# Patient Record
Sex: Female | Born: 1975
Health system: Southern US, Community
[De-identification: ages and names within clinical notes are randomized; demographics above are authoritative.]

## PROBLEM LIST (undated history)

## (undated) DIAGNOSIS — M24411 Recurrent dislocation, right shoulder: Secondary | ICD-10-CM

## (undated) DIAGNOSIS — E039 Hypothyroidism, unspecified: Secondary | ICD-10-CM

## (undated) DIAGNOSIS — B029 Zoster without complications: Secondary | ICD-10-CM

## (undated) DIAGNOSIS — D593 Hemolytic-uremic syndrome: Secondary | ICD-10-CM

## (undated) HISTORY — DX: Zoster without complications: B02.9

## (undated) HISTORY — DX: Hemolytic-uremic syndrome: D59.3

## (undated) HISTORY — DX: Hypothyroidism, unspecified: E03.9

## (undated) HISTORY — DX: Recurrent dislocation, right shoulder: M24.411

---
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
## 2013-01-05 ENCOUNTER — Telehealth: Payer: Self-pay | Admitting: General Practice

## 2013-01-05 NOTE — Telephone Encounter (Signed)
Called patient, no answer- left message stating we are trying to get in touch with you to ask you a few questions before your appointment with [place], please call us back at the clinics

## 2013-01-05 NOTE — Telephone Encounter (Signed)
Message copied by Kathee Delton on Tue Jan 05, 2013  2:17 PM ------      Message from: Freddi Starr      Created: Mon Jan 04, 2013  2:10 PM       Can we please ensure that patient has insurance prior to arrival for appointment next week. Patient is scheduled as a new patient for pap smear and breast exam. If she does not have insurance we should offer her a referral to [facility] if she would like instead of receiving a bill from [place].             Thanks,             Raynelle Fanning ------

## 2013-01-11 ENCOUNTER — Encounter: Payer: Self-pay | Admitting: Medical

## 2013-01-13 ENCOUNTER — Telehealth: Payer: Self-pay

## 2013-01-13 NOTE — Telephone Encounter (Signed)
Opened in error

## 2013-01-13 NOTE — Telephone Encounter (Signed)
Called pt and I asked pt if she had insurance pt stated that she had [employer].  I explained to the pt that the provider was not for sure that she had insurance and we wanted to make sure so that she did not accrue a bill when we could get a free pap smear screening for her.  Pt stated "no, I do have insurance".  I verified with pt her appt scheduled on 02/03/13 @ 1245 and pt stated understanding.

## 2013-02-04 ENCOUNTER — Encounter: Payer: Self-pay | Admitting: Medical

## 2013-03-19 ENCOUNTER — Encounter: Payer: Self-pay | Admitting: Medical

## 2015-02-26 HISTORY — PX: OTHER SURGICAL HISTORY: SHX169

## 2018-01-16 LAB — HEMOGLOBIN A1C: HEMOGLOBIN A1C: 5.1

## 2018-01-19 LAB — CBC AND DIFFERENTIAL
HCT: 42 (ref 36–46)
Hemoglobin: 13.5 (ref 12.0–16.0)
Platelets: 163 (ref 150–399)
WBC: 5.1

## 2018-01-19 LAB — TSH: TSH: 4.6 (ref 0.41–5.90)

## 2018-01-19 LAB — BASIC METABOLIC PANEL
BUN: 12 (ref 4–21)
Creatinine: 1 (ref 0.5–1.1)
GLUCOSE: 82
Potassium: 4 (ref 3.4–5.3)
SODIUM: 137 (ref 137–147)

## 2018-01-19 LAB — LIPID PANEL
Cholesterol: 214 — AB (ref 0–200)
HDL: 58 (ref 35–70)
LDL Cholesterol: 145
Triglycerides: 58 (ref 40–160)

## 2018-01-19 LAB — HEPATIC FUNCTION PANEL
ALT: 23 (ref 7–35)
AST: 18 (ref 13–35)
Alkaline Phosphatase: 52 (ref 25–125)

## 2018-03-16 ENCOUNTER — Ambulatory Visit: Payer: Self-pay | Admitting: Family Medicine

## 2018-03-30 ENCOUNTER — Ambulatory Visit: Payer: Self-pay | Admitting: Family Medicine

## 2018-03-30 NOTE — Progress Notes (Deleted)
Tara Snyder DOB: 1975/10/28 Encounter date: 03/30/2018  This isa 43 y.o. female who presents to establish care. No chief complaint on file.   History of present illness:  ***   No past medical history on file. *** The histories are not reviewed yet. Please review them in the "History" navigator section and refresh this SmartLink. Allergies not on file No outpatient medications have been marked as taking for the 03/30/18 encounter (Appointment) with Wynn Banker, MD.   Social History   Tobacco Use  . Smoking status: Not on file  Substance Use Topics  . Alcohol use: Not on file   No family history on file.   Review of Systems  Objective:  There were no vitals taken for this visit.      BP Readings from Last 3 Encounters:  No data found for BP   Wt Readings from Last 3 Encounters:  No data found for Wt    Physical Exam  Assessment/Plan:  There are no diagnoses linked to this encounter.  No follow-ups on file.  Theodis Shove, MD

## 2018-04-13 DIAGNOSIS — L810 Postinflammatory hyperpigmentation: Secondary | ICD-10-CM | POA: Diagnosis not present

## 2018-04-13 DIAGNOSIS — L309 Dermatitis, unspecified: Secondary | ICD-10-CM | POA: Diagnosis not present

## 2018-04-20 ENCOUNTER — Encounter

## 2018-04-20 ENCOUNTER — Encounter: Payer: Self-pay | Admitting: Family Medicine

## 2018-04-20 ENCOUNTER — Ambulatory Visit: Payer: Commercial Managed Care - PPO | Admitting: Family Medicine

## 2018-04-20 VITALS — BP 118/60 | HR 80 | Temp 97.9°F | Ht 62.0 in | Wt 191.2 lb

## 2018-04-20 DIAGNOSIS — Z1239 Encounter for other screening for malignant neoplasm of breast: Secondary | ICD-10-CM | POA: Diagnosis not present

## 2018-04-20 DIAGNOSIS — E669 Obesity, unspecified: Secondary | ICD-10-CM

## 2018-04-20 NOTE — Progress Notes (Signed)
Tara Snyder DOB: 08-09-1975 Encounter date: 04/20/2018  This is a 43 y.o. female who presents to establish care. Chief Complaint  Patient presents with  . New Patient (Initial Visit)    History of present illness: Previous PCP had a lot of turnover. Would like more stability.   No specific concerns today. 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 Has recently (in last year+ started back to work).  Seeing derm; rash on leg. Marcha Dutton is working this up. Has biopsy pending. Rash has been there since July.   Has been on thyroid medication for about 5 years. Has been pretty stable on dose. If she misses one she will break out so she is pretty good about taking medication.   As child had Hemolytic Uremic Syndrome. Hasn't had issues with bladder/kidneys since then. Was hospitalized with this. Back in 6th grade.   Told she had high cholesterol with bloodwork in past. Last bloodwork was 3 months ago.  Past Medical History:  Diagnosis Date  . HUS (hemolytic uremic syndrome) ([facility]) 1987  . Hypothyroid   . Shingles   . Shoulder dislocation, recurrent, right    Past Surgical History:  Procedure Laterality Date  . right shoulder  2017   recurrent dislocation right shoulder; surgery for stabilization   No Known Allergies Current Meds  Medication Sig  . clobetasol cream (TEMOVATE) 0.05 % APPLY TO SPOTS TWICE A DAY FOR 2 WEEKS, THEN STOP  . levothyroxine (SYNTHROID, LEVOTHROID) 137 MCG tablet levothyroxine 137 mcg tablet  TAKE 1 TABLET BY MOUTH EVERY DAY   Social History   Tobacco Use  . Smoking status: Never Smoker  . Smokeless tobacco: Never Used  Substance Use Topics  . Alcohol use: Not on file   Family History  Problem Relation Age of Onset  . High Cholesterol Mother   . Heart attack Father   . Heart disease Father   . High Cholesterol Father   . High blood pressure Father   . Cancer Maternal Grandmother        blood  . Other  Maternal Grandmother        skin issues; multiple  . Diabetes Maternal Grandfather        insulin depdt  . High blood pressure Paternal Grandmother   . High Cholesterol Paternal Grandmother   . Stroke Paternal Grandmother 22  . Obesity Paternal Grandmother   . Hearing loss Paternal Grandfather   . Heart attack Paternal Grandfather 77  . Heart disease Paternal Grandfather   . High Cholesterol Brother      Review of Systems  Constitutional: Negative for chills, fatigue and fever.  Respiratory: Negative for cough, chest tightness, shortness of breath and wheezing.   Cardiovascular: Negative for chest pain, palpitations and leg swelling.    Objective:  BP 118/60 (BP Location: Left Arm, Patient Position: Sitting, Cuff Size: Large)   Pulse 80   Temp 97.9 F (36.6 C) (Oral)   Ht 5\' 2"  (1.575 m)   Wt 191 lb 3.2 oz (86.7 kg)   LMP 04/18/2018 (Exact Date) Comment: currently having cycle  SpO2 99%   BMI 34.97 kg/m   Weight: 191 lb 3.2 oz (86.7 kg)   BP Readings from Last 3 Encounters:  04/20/18 118/60   Wt Readings from Last 3 Encounters:  04/20/18 191 lb 3.2 oz (86.7 kg)    Physical Exam Constitutional:      General: She is not in acute distress.  Appearance: She is well-developed.  Cardiovascular:     Rate and Rhythm: Normal rate and regular rhythm.     Heart sounds: Normal heart sounds. No murmur. No friction rub.  Pulmonary:     Effort: Pulmonary effort is normal. No respiratory distress.     Breath sounds: Normal breath sounds. No wheezing or rales.  Abdominal:     General: Abdomen is flat. Bowel sounds are normal. There is no distension.     Palpations: Abdomen is soft.     Tenderness: There is no abdominal tenderness.  Musculoskeletal:     Right lower leg: No edema.     Left lower leg: No edema.  Neurological:     Mental Status: She is alert and oriented to person, place, and time.  Psychiatric:        Behavior: Behavior normal.     Assessment/Plan: 1.  Screening for breast cancer Ordered today and number given to call for scheduling. Let us know if any problems with scheduling. - MM DIGITAL SCREENING BILATERAL; Future  2. Obesity (BMI 30-39.9) Continue with regular exercise.  We discussed stopping snacking after 7 PM at night, which she feels is an area where she is requiring a significant access of calories.  She has had success with weight loss and doing this in the past.  Return pending record review. Once we get records we can review lab work that she has had and establish when her next physical as needed.  Theodis Shove, MD   Is going to try to limit evening snacking and drink hot tea.

## 2018-04-20 NOTE — Patient Instructions (Addendum)
I will call you when I get your records. If you have not heard from me in 2 months; please touch base and ask if we received these.   Remember your why :) I think you will notice a great change with stopping the evening snacking and switching to tea drinking. Let me know if any concerns before you hear from me.

## 2018-04-24 ENCOUNTER — Ambulatory Visit: Payer: Commercial Managed Care - PPO | Admitting: Family Medicine

## 2018-04-24 DIAGNOSIS — L309 Dermatitis, unspecified: Secondary | ICD-10-CM | POA: Diagnosis not present

## 2018-04-30 DIAGNOSIS — L280 Lichen simplex chronicus: Secondary | ICD-10-CM | POA: Diagnosis not present

## 2018-04-30 DIAGNOSIS — L308 Other specified dermatitis: Secondary | ICD-10-CM | POA: Diagnosis not present

## 2018-05-07 DIAGNOSIS — L280 Lichen simplex chronicus: Secondary | ICD-10-CM | POA: Diagnosis not present

## 2018-05-07 DIAGNOSIS — L309 Dermatitis, unspecified: Secondary | ICD-10-CM | POA: Diagnosis not present

## 2018-05-08 ENCOUNTER — Encounter: Payer: Self-pay | Admitting: Family Medicine

## 2018-05-09 ENCOUNTER — Telehealth: Payer: Self-pay | Admitting: Family Medicine

## 2018-05-09 NOTE — Telephone Encounter (Signed)
Please let patient know that I reviewed her record. Her last bloodwork was just in November and looked good. I would suggest we plan to do a repeat physical in November-December. If she would like to do a follow up in may for touching base with weight loss and to do a recheck of her thyroid that would also be reasonable.

## 2018-05-11 NOTE — Telephone Encounter (Signed)
Spoke with patient. She stated as of now she would like to come in around November-December.

## 2018-05-13 DIAGNOSIS — Z7689 Persons encountering health services in other specified circumstances: Secondary | ICD-10-CM | POA: Diagnosis not present

## 2018-05-13 DIAGNOSIS — L309 Dermatitis, unspecified: Secondary | ICD-10-CM | POA: Diagnosis not present

## 2018-05-14 ENCOUNTER — Encounter: Payer: Self-pay | Admitting: Family Medicine

## 2018-05-15 ENCOUNTER — Ambulatory Visit: Payer: Commercial Managed Care - PPO

## 2018-06-12 ENCOUNTER — Ambulatory Visit: Payer: Commercial Managed Care - PPO

## 2018-07-27 ENCOUNTER — Ambulatory Visit: Payer: Commercial Managed Care - PPO

## 2018-08-24 ENCOUNTER — Telehealth: Payer: Self-pay | Admitting: *Deleted

## 2018-08-24 ENCOUNTER — Encounter: Payer: Self-pay | Admitting: Family Medicine

## 2018-08-24 ENCOUNTER — Ambulatory Visit: Payer: Self-pay | Admitting: Family Medicine

## 2018-08-24 ENCOUNTER — Other Ambulatory Visit: Payer: Self-pay

## 2018-08-24 ENCOUNTER — Ambulatory Visit (INDEPENDENT_AMBULATORY_CARE_PROVIDER_SITE_OTHER): Payer: Commercial Managed Care - PPO | Admitting: Family Medicine

## 2018-08-24 ENCOUNTER — Ambulatory Visit: Payer: Commercial Managed Care - PPO

## 2018-08-24 DIAGNOSIS — U071 COVID-19: Secondary | ICD-10-CM

## 2018-08-24 DIAGNOSIS — Z20822 Contact with and (suspected) exposure to covid-19: Secondary | ICD-10-CM

## 2018-08-24 DIAGNOSIS — R6889 Other general symptoms and signs: Secondary | ICD-10-CM

## 2018-08-24 NOTE — Telephone Encounter (Signed)
Pt. Reports her husband had been having COVID symptoms and has tested positive. States she has been tired lately. Would like to be tested. Warm transfer to [place].  Answer Assessment - Initial Assessment Questions 1. CLOSE CONTACT: "Who is the person with the confirmed or suspected COVID-19 infection that you were exposed to?"     Husband 2. PLACE of CONTACT: "Where were you when you were exposed to COVID-19?" (e.g., home, school, medical waiting room; which city?)     Home 3. TYPE of CONTACT: "How much contact was there?" (e.g., sitting next to, live in same house, work in same office, same building)     Home 4. DURATION of CONTACT: "How long were you in contact with the COVID-19 patient?" (e.g., a few seconds, passed by person, a few minutes, live with the patient)     Same house 5. DATE of CONTACT: "When did you have contact with a COVID-19 patient?" (e.g., how many days ago)     Present 6. TRAVEL: "Have you traveled out of the country recently?" If so, "When and where?"     * Also ask about out-of-state travel, since the CDC has identified some high-risk cities for community spread in the [place].     * Note: Travel becomes less relevant if there is widespread community transmission where the patient lives.     No 7. COMMUNITY SPREAD: "Are there lots of cases of COVID-19 (community spread) where you live?" (See public health department website, if unsure)       Yes 8. SYMPTOMS: "Do you have any symptoms?" (e.g., fever, cough, breathing difficulty)     Tired 9. PREGNANCY OR POSTPARTUM: "Is there any chance you are pregnant?" "When was your last menstrual period?" "Did you deliver in the last 2 weeks?"     No 10. HIGH RISK: "Do you have any heart or lung problems? Do you have a weak immune system?" (e.g., CHF, COPD, asthma, HIV positive, chemotherapy, renal failure, diabetes mellitus, sickle cell anemia)       No  Protocols used: CORONAVIRUS (COVID-19) EXPOSURE-A-AH

## 2018-08-24 NOTE — Telephone Encounter (Signed)
covid testing scheduled [place] 6.30.20 at 8:15

## 2018-08-24 NOTE — Telephone Encounter (Signed)
-----   Message from Elie Confer, [place] sent at 08/24/2018 12:30 PM EDT ----- Good afternoon,  Dr. Sarajane Jews would like to have this pt tested for covid 19.  Pts husband was tested and tested positive.  She has had Gi issues and feeling fatigued.  Thanks

## 2018-08-24 NOTE — Progress Notes (Signed)
Subjective:    Patient ID: Tara Snyder, female    DOB: 29-Mar-1975, 43 y.o.   MRN: [id number]  HPI Virtual Visit via Video Note  I connected with the patient on 08/24/18 at  9:45 AM EDT by a video enabled telemedicine application and verified that I am speaking with the correct person using two identifiers.  Location patient: home Location provider:work or home office Persons participating in the virtual visit: patient, provider  I discussed the limitations of evaluation and management by telemedicine and the availability of in person appointments. The patient expressed understanding and agreed to proceed.   HPI: Here asking for a Covid-19 test. She and her husband have had typical symptoms, and she was the first one with symptoms dating back to 14 days ago. She has had abdominal pains, diarrhea, body aches, fatigue, a dry cough, and mild SOB and chest pains. No fever. Her husband developed similar symptoms a few days later, and last week he tested positive for the virus. Today her symptoms are greatly improved. She is drinking fluids. They have been quarantining at home the past 2 weeks.    ROS: See pertinent positives and negatives per HPI.  Past Medical History:  Diagnosis Date  . HUS (hemolytic uremic syndrome) ([facility]) 1987  . Hypothyroid   . Shingles   . Shoulder dislocation, recurrent, right     Past Surgical History:  Procedure Laterality Date  . right shoulder  2017   recurrent dislocation right shoulder; surgery for stabilization    Family History  Problem Relation Age of Onset  . High Cholesterol Mother   . Heart attack Father   . Heart disease Father   . High Cholesterol Father   . High blood pressure Father   . Cancer Maternal Grandmother        blood  . Other Maternal Grandmother        skin issues; multiple  . Diabetes Maternal Grandfather        insulin depdt  . High blood pressure Paternal Grandmother   . High Cholesterol Paternal Grandmother   .  Stroke Paternal Grandmother 41  . Obesity Paternal Grandmother   . Hearing loss Paternal Grandfather   . Heart attack Paternal Grandfather 7  . Heart disease Paternal Grandfather   . High Cholesterol Brother      Current Outpatient Medications:  .  clobetasol cream (TEMOVATE) 0.05 %, APPLY TO SPOTS TWICE A DAY FOR 2 WEEKS, THEN STOP, Disp: , Rfl:  .  levothyroxine (SYNTHROID, LEVOTHROID) 137 MCG tablet, levothyroxine 137 mcg tablet  TAKE 1 TABLET BY MOUTH EVERY DAY, Disp: , Rfl:   EXAM:  VITALS per patient if applicable:  GENERAL: alert, oriented, appears well and in no acute distress  HEENT: atraumatic, conjunttiva clear, no obvious abnormalities on inspection of external nose and ears  NECK: normal movements of the head and neck  LUNGS: on inspection no signs of respiratory distress, breathing rate appears normal, no obvious gross SOB, gasping or wheezing  CV: no obvious cyanosis  MS: moves all visible extremities without noticeable abnormality  PSYCH/NEURO: pleasant and cooperative, no obvious depression or anxiety, speech and thought processing grossly intact  ASSESSMENT AND PLAN: She likely has a Covid-19 infection. We will arrange for her to be tested today. Alysia Penna, MD  Discussed the following assessment and plan:  No diagnosis found.     I discussed the assessment and treatment plan with the patient. The patient was provided an opportunity to ask  questions and all were answered. The patient agreed with the plan and demonstrated an understanding of the instructions.   The patient was advised to call back or seek an in-person evaluation if the symptoms worsen or if the condition fails to improve as anticipated.     Review of Systems     Objective:   Physical Exam        Assessment & Plan:

## 2018-08-24 NOTE — Telephone Encounter (Signed)
LM for pt to call [phone] M-F 7a-7p to schedule covid testing. Order placed  

## 2018-08-24 NOTE — Telephone Encounter (Signed)
Pt has been scheduled for virtual visit  

## 2018-08-25 ENCOUNTER — Other Ambulatory Visit: Payer: Commercial Managed Care - PPO

## 2018-08-25 DIAGNOSIS — Z20822 Contact with and (suspected) exposure to covid-19: Secondary | ICD-10-CM

## 2018-09-01 LAB — NOVEL CORONAVIRUS, NAA: SARS-CoV-2, NAA: DETECTED — AB

## 2018-09-02 ENCOUNTER — Telehealth: Payer: Self-pay | Admitting: Internal Medicine

## 2018-09-02 ENCOUNTER — Encounter: Payer: Self-pay | Admitting: Internal Medicine

## 2018-09-02 DIAGNOSIS — U071 COVID-19: Secondary | ICD-10-CM | POA: Insufficient documentation

## 2018-09-02 NOTE — Telephone Encounter (Signed)
Tested + COVID 19   Please call to inform the patient for further instructions  TMS 

## 2018-09-03 NOTE — Telephone Encounter (Signed)
Dr. Sarajane Jews saw her when I was out.   COVID testing is positive, which she suspected. Please make sure she is symptomatically feeling better (looks like she was starting to feel better at time of appointment).   Needs to be isolated for at least 10 days after start of symptoms AND for at least 72 hours after being symptom free (no fevers, chills, cough, diarrhea).   If any worsening of symptoms needs appointment.

## 2018-09-03 NOTE — Telephone Encounter (Signed)
I left a message for the pt to return my call. 

## 2018-09-07 NOTE — Telephone Encounter (Signed)
See results note. 

## 2019-04-13 ENCOUNTER — Telehealth: Payer: Self-pay | Admitting: Family Medicine

## 2019-04-13 NOTE — Telephone Encounter (Signed)
Pt is requesting a refill on Levothyroxine(LEVOTHROID) 137 MCG tablet. Pt uses [facility] [address] in [place]. Thanks

## 2019-04-14 ENCOUNTER — Other Ambulatory Visit: Payer: Self-pay | Admitting: Family Medicine

## 2019-04-14 MED ORDER — LEVOTHYROXINE SODIUM 137 MCG PO TABS: ORAL_TABLET | ORAL | 1 refills | Status: DC

## 2019-04-14 NOTE — Telephone Encounter (Signed)
done

## 2019-10-13 ENCOUNTER — Ambulatory Visit: Payer: Commercial Managed Care - PPO | Admitting: Family Medicine

## 2019-10-14 ENCOUNTER — Other Ambulatory Visit: Payer: Self-pay | Admitting: Family Medicine

## 2019-10-27 ENCOUNTER — Ambulatory Visit (INDEPENDENT_AMBULATORY_CARE_PROVIDER_SITE_OTHER): Payer: Commercial Managed Care - PPO | Admitting: Family Medicine

## 2019-10-27 ENCOUNTER — Encounter: Payer: Self-pay | Admitting: Family Medicine

## 2019-10-27 ENCOUNTER — Other Ambulatory Visit: Payer: Self-pay

## 2019-10-27 VITALS — BP 98/60 | HR 74 | Temp 98.1°F | Ht 62.0 in

## 2019-10-27 DIAGNOSIS — Z23 Encounter for immunization: Secondary | ICD-10-CM | POA: Diagnosis not present

## 2019-10-27 DIAGNOSIS — E785 Hyperlipidemia, unspecified: Secondary | ICD-10-CM | POA: Diagnosis not present

## 2019-10-27 DIAGNOSIS — Z1159 Encounter for screening for other viral diseases: Secondary | ICD-10-CM | POA: Diagnosis not present

## 2019-10-27 DIAGNOSIS — Z1231 Encounter for screening mammogram for malignant neoplasm of breast: Secondary | ICD-10-CM | POA: Diagnosis not present

## 2019-10-27 DIAGNOSIS — R339 Retention of urine, unspecified: Secondary | ICD-10-CM

## 2019-10-27 LAB — POC URINALSYSI DIPSTICK (AUTOMATED)
Bilirubin, UA: NEGATIVE
Blood, UA: NEGATIVE
Glucose, UA: NEGATIVE
Ketones, UA: NEGATIVE
Leukocytes, UA: NEGATIVE
Nitrite, UA: NEGATIVE
Protein, UA: NEGATIVE
Spec Grav, UA: 1.02 (ref 1.010–1.025)
Urobilinogen, UA: 0.2 E.U./dL
pH, UA: 6 (ref 5.0–8.0)

## 2019-10-27 NOTE — Progress Notes (Signed)
[name] DOB: 07-Feb-1976 Encounter date: 10/27/2019  This is a 44 y.o. female who presents with Chief Complaint  Patient presents with   Dysuria    patient complains of difficulty emptying bladder and leakage x2 months ago, noticed leakage after birth of her son in 2010    History of present illness:  Harder to complete urination. Bends forward to get out some of urine. No discomfort with urination. More urgency for urination at night - gets up once. Always some pressure/sensation of needing to urinate. Daily bowel movements. If not having time to complete urination then can note some leaking. Does leak with stress.   [facility].   Did add omega, vitamin B12, vit D.    No Known Allergies Current Meds  Medication Sig   clobetasol cream (TEMOVATE) 0.05 % APPLY TO SPOTS TWICE A DAY FOR 2 WEEKS, THEN STOP   [DISCONTINUED] levothyroxine (SYNTHROID) 137 MCG tablet TAKE 1 TABLET BY MOUTH EVERY DAY    Review of Systems  Constitutional: Negative for chills, fatigue and fever.  Respiratory: Negative for cough, chest tightness, shortness of breath and wheezing.   Cardiovascular: Negative for chest pain, palpitations and leg swelling.  Genitourinary: Positive for difficulty urinating and frequency. Negative for dysuria and hematuria.    Objective:  BP 98/60 (BP Location: Left Arm, Patient Position: Sitting, Cuff Size: Large)    Pulse 74    Temp 98.1 F (36.7 C) (Oral)    Ht 5\' 2"  (1.575 m)    LMP 10/06/2019 (Approximate)    BMI 34.97 kg/m       BP Readings from Last 3 Encounters:  10/27/19 98/60  04/20/18 118/60   Wt Readings from Last 3 Encounters:  04/20/18 191 lb 3.2 oz (86.7 kg)    Physical Exam Constitutional:      General: She is not in acute distress.    Appearance: She is well-developed.  Cardiovascular:     Rate and Rhythm: Normal rate and regular rhythm.     Heart sounds: Normal heart sounds. No murmur heard.  No friction  rub.  Pulmonary:     Effort: Pulmonary effort is normal. No respiratory distress.     Breath sounds: Normal breath sounds. No wheezing or rales.  Abdominal:     General: Abdomen is flat. Bowel sounds are normal.     Tenderness: There is no abdominal tenderness. There is no right CVA tenderness or left CVA tenderness.  Musculoskeletal:     Right lower leg: No edema.     Left lower leg: No edema.  Neurological:     Mental Status: She is alert and oriented to person, place, and time.  Psychiatric:        Behavior: Behavior normal.     Assessment/Plan  1. Incomplete emptying of bladder Encouraged follow up with [facility]. In meanwhile we will test urine here and make sure no abnormalities. She had bloodwork done through work screening and will try to locate this but last labs were over a year ago.  UA was normal, culture negative. We reviewed that there is likely cystocele or laxity in vaginal wall contributing to poor emptying. She declined vaginal exam today. We discussed that frequently pelvic floor therapy is helpful, but sometimes there is need for surgical correction. Either way, further exam with her gynecologist should help determine best next step.   2. Hyperlipidemia Will recheck labs today.    Return if symptoms worsen or fail  to improve.    Theodis Shove, MD

## 2019-10-27 NOTE — Patient Instructions (Signed)
Call your gynecology office for follow up. Let them know that you are having some "incomplete emptying" of the bladder and would like evaluation for this. We can always refer to urology if needed, but gynecology is a good place to start.

## 2019-10-28 LAB — CBC WITH DIFFERENTIAL/PLATELET
Absolute Monocytes: 389 cells/uL (ref 200–950)
Basophils Absolute: 53 cells/uL (ref 0–200)
Basophils Relative: 0.8 %
Eosinophils Absolute: 488 cells/uL (ref 15–500)
Eosinophils Relative: 7.4 %
HCT: 39 % (ref 35.0–45.0)
Hemoglobin: 13.3 g/dL (ref 11.7–15.5)
Lymphs Abs: 1980 cells/uL (ref 850–3900)
MCH: 30.3 pg (ref 27.0–33.0)
MCHC: 34.1 g/dL (ref 32.0–36.0)
MCV: 88.8 fL (ref 80.0–100.0)
MPV: 11.2 fL (ref 7.5–12.5)
Monocytes Relative: 5.9 %
Neutro Abs: 3689 cells/uL (ref 1500–7800)
Neutrophils Relative %: 55.9 %
Platelets: 170 10*3/uL (ref 140–400)
RBC: 4.39 10*6/uL (ref 3.80–5.10)
RDW: 11.9 % (ref 11.0–15.0)
Total Lymphocyte: 30 %
WBC: 6.6 10*3/uL (ref 3.8–10.8)

## 2019-10-28 LAB — COMPREHENSIVE METABOLIC PANEL
AG Ratio: 2.2 (calc) (ref 1.0–2.5)
ALT: 12 U/L (ref 6–29)
AST: 12 U/L (ref 10–30)
Albumin: 4.2 g/dL (ref 3.6–5.1)
Alkaline phosphatase (APISO): 60 U/L (ref 31–125)
BUN: 14 mg/dL (ref 7–25)
CO2: 26 mmol/L (ref 20–32)
Calcium: 9.5 mg/dL (ref 8.6–10.2)
Chloride: 104 mmol/L (ref 98–110)
Creat: 0.86 mg/dL (ref 0.50–1.10)
Globulin: 1.9 g/dL (calc) (ref 1.9–3.7)
Glucose, Bld: 84 mg/dL (ref 65–99)
Potassium: 4.3 mmol/L (ref 3.5–5.3)
Sodium: 138 mmol/L (ref 135–146)
Total Bilirubin: 0.7 mg/dL (ref 0.2–1.2)
Total Protein: 6.1 g/dL (ref 6.1–8.1)

## 2019-10-28 LAB — URINE CULTURE
MICRO NUMBER:: 10899214
SPECIMEN QUALITY:: ADEQUATE

## 2019-10-28 LAB — LIPID PANEL
Cholesterol: 191 mg/dL (ref <200)
HDL: 57 mg/dL (ref >=50)
LDL Cholesterol (Calc): 118 mg/dL (calc) — ABNORMAL HIGH
Non-HDL Cholesterol (Calc): 134 mg/dL (calc) — ABNORMAL HIGH (ref <130)
Total CHOL/HDL Ratio: 3.4 (calc) (ref <5.0)
Triglycerides: 66 mg/dL (ref <150)

## 2019-10-28 LAB — TSH: TSH: 6.02 mIU/L — ABNORMAL HIGH

## 2019-10-28 LAB — HEPATITIS C ANTIBODY
Hepatitis C Ab: NONREACTIVE
SIGNAL TO CUT-OFF: 0.02 (ref <1.00)

## 2019-10-29 MED ORDER — LEVOTHYROXINE SODIUM 150 MCG PO TABS: 150.0000 ug | ORAL_TABLET | Freq: Every day | ORAL | 1 refills | Status: DC

## 2019-11-14 ENCOUNTER — Other Ambulatory Visit: Payer: Self-pay | Admitting: Family Medicine

## 2019-11-17 ENCOUNTER — Ambulatory Visit
Admission: RE | Admit: 2019-11-17 | Discharge: 2019-11-17 | Disposition: A | Discharge status: Home / Self Care | Payer: Commercial Managed Care - PPO | Source: Ambulatory Visit | Attending: Family Medicine | Admitting: Family Medicine

## 2019-11-17 ENCOUNTER — Other Ambulatory Visit: Payer: Self-pay

## 2019-11-17 DIAGNOSIS — Z1231 Encounter for screening mammogram for malignant neoplasm of breast: Secondary | ICD-10-CM

## 2020-04-24 ENCOUNTER — Other Ambulatory Visit: Payer: Self-pay | Admitting: Family Medicine

## 2020-08-04 ENCOUNTER — Other Ambulatory Visit: Payer: Self-pay | Admitting: Family Medicine

## 2020-10-29 ENCOUNTER — Other Ambulatory Visit: Payer: Self-pay | Admitting: Family Medicine

## 2021-01-30 ENCOUNTER — Other Ambulatory Visit: Payer: Self-pay | Admitting: Family Medicine

## 2021-02-13 ENCOUNTER — Telehealth: Payer: Self-pay | Admitting: Family Medicine

## 2021-02-13 ENCOUNTER — Other Ambulatory Visit: Payer: Self-pay | Admitting: Family Medicine

## 2021-02-13 NOTE — Telephone Encounter (Signed)
Rx done. 

## 2021-02-13 NOTE — Telephone Encounter (Signed)
Pt has sch for and appt on 03-21-2021 and would like a refill on levothyroxine (SYNTHROID) 150 MCG tablet  [id number] - [place], [place] - [address] [place] [address] AT [place] [address] Phone:  [phone]  Fax:  [phone]

## 2021-03-07 ENCOUNTER — Other Ambulatory Visit: Payer: Self-pay | Admitting: Family Medicine

## 2021-03-08 LAB — HM PAP SMEAR: HM Pap smear: NEGATIVE

## 2021-03-21 ENCOUNTER — Ambulatory Visit (INDEPENDENT_AMBULATORY_CARE_PROVIDER_SITE_OTHER): Payer: Commercial Managed Care - PPO | Admitting: Family Medicine

## 2021-03-21 VITALS — BP 104/74 | HR 73 | Temp 97.5°F | Ht 62.0 in | Wt 196.5 lb

## 2021-03-21 DIAGNOSIS — E039 Hypothyroidism, unspecified: Secondary | ICD-10-CM | POA: Diagnosis not present

## 2021-03-21 DIAGNOSIS — E785 Hyperlipidemia, unspecified: Secondary | ICD-10-CM

## 2021-03-21 DIAGNOSIS — Z8632 Personal history of gestational diabetes: Secondary | ICD-10-CM | POA: Diagnosis not present

## 2021-03-21 DIAGNOSIS — Z8249 Family history of ischemic heart disease and other diseases of the circulatory system: Secondary | ICD-10-CM | POA: Diagnosis not present

## 2021-03-21 DIAGNOSIS — R232 Flushing: Secondary | ICD-10-CM

## 2021-03-21 NOTE — Progress Notes (Signed)
Tara Snyder DOB: 1976/01/29 Encounter date: 03/21/2021  This is a 46 y.o. female who presents with Chief Complaint  Patient presents with   Follow-up    History of present illness:  Feels like she has been eating fairly well, but weight is not improving. She has been stuck around current weight. Would like to be around 150. Tried intermittent fasting - did ok before holidays. Doesn't usually eat breakfast. Doing more whole grains. Walks about 5 days/week about 1-2 miles. (30 min)  She does feel more tired.   Trouble with staying asleep at night. No racing thoughts.   Seeing [facility].   Periods are light; variable length; variable onset.   Gestational diabetes diet controlled with first pregnancy.    No Known Allergies Current Meds  Medication Sig   levothyroxine (SYNTHROID) 150 MCG tablet TAKE 1 TABLET BY MOUTH EVERY DAY    Review of Systems  Constitutional:  Negative for chills, fatigue and fever.  Respiratory:  Negative for cough, chest tightness, shortness of breath and wheezing.   Cardiovascular:  Negative for chest pain, palpitations and leg swelling.  Genitourinary:        Irregular menses; she is getting hot flashes daily; sometimes multiple times through day.   Psychiatric/Behavioral:  Sleep disturbance: does wake up due to hot flashes.    Objective:  BP 104/74 (BP Location: Left Arm, Patient Position: Sitting, Cuff Size: Large)    Pulse 73    Temp (!) 97.5 F (36.4 C) (Oral)    Ht 5\' 2"  (1.575 m)    Wt 196 lb 8 oz (89.1 kg)    LMP 12/26/2020 (Approximate)    SpO2 99%    BMI 35.94 kg/m   Weight: 196 lb 8 oz (89.1 kg)   BP Readings from Last 3 Encounters:  03/21/21 104/74  10/27/19 98/60  04/20/18 118/60   Wt Readings from Last 3 Encounters:  03/21/21 196 lb 8 oz (89.1 kg)  04/20/18 191 lb 3.2 oz (86.7 kg)    Physical Exam Constitutional:      General: She is not in acute distress.    Appearance: She is well-developed.   Neck:     Thyroid: No thyroid mass, thyromegaly or thyroid tenderness.  Cardiovascular:     Rate and Rhythm: Normal rate and regular rhythm.     Heart sounds: Normal heart sounds. No murmur heard.   No friction rub.  Pulmonary:     Effort: Pulmonary effort is normal. No respiratory distress.     Breath sounds: Normal breath sounds. No wheezing or rales.  Musculoskeletal:     Right lower leg: No edema.     Left lower leg: No edema.  Neurological:     Mental Status: She is alert and oriented to person, place, and time.  Psychiatric:        Behavior: Behavior normal.    Assessment/Plan 1. Hypothyroidism, unspecified type She is due for recheck of blood work.  Uncertain if thyroid is well controlled as last labs were done quite some time ago.   - CBC with Differential/Platelet; Future - TSH; Future - TSH - CBC with Differential/Platelet  2. Hyperlipidemia, unspecified hyperlipidemia type She does worry about family history of heart disease.  Last cholesterol was fairly well controlled.  She had blood work done through work and we will try to get the results of this.  States that cholesterol looked good this past year as well.  We discussed consideration for NMR  profile in the future.  We discussed coronary calcium scoring testing due to father's premature heart attack and coronary artery disease.  She is interested in doing this and taking steps for prevention of cardiovascular disease in herself. - Comprehensive metabolic panel; Future - Comprehensive metabolic panel  3. History of gestational diabetes She is having some difficulty with weight loss.  She is perimenopausal.  Does have a history of gestational diabetes which we discussed likely means that she has some insulin resistance and may also be more difficult.  Encouraged her to continue lower carbohydrate diet.  I will check some blood work today.  We did discuss consideration for weight loss medications in the GLP-1 category if  desired.  Encouraged her to continue with exercise and continue to work on high intensity interval training to help with weight loss. - Hemoglobin A1c; Future - Hemoglobin A1c  4. Family history of heart disease - CT CARDIAC SCORING (SELF PAY ONLY); Future  5. Hot flashes She is following regularly with gynecology.  I did give her some information about dietary and nonhormonal control of hot flashes today.  We discussed that there are some medications that can help with this if it is becoming a nuisance or interference in her daily activities.    Return for pending lab or imaging results.     Micheline Rough, MD

## 2021-03-21 NOTE — Patient Instructions (Signed)
If you have lipid panel from your work; please get those results to me to add to your chart.

## 2021-03-22 ENCOUNTER — Other Ambulatory Visit: Payer: Self-pay | Admitting: Family Medicine

## 2021-03-22 LAB — COMPREHENSIVE METABOLIC PANEL
ALT: 14 U/L (ref 0–35)
AST: 15 U/L (ref 0–37)
Albumin: 4.5 g/dL (ref 3.5–5.2)
Alkaline Phosphatase: 63 U/L (ref 39–117)
BUN: 18 mg/dL (ref 6–23)
CO2: 29 mEq/L (ref 19–32)
Calcium: 9.8 mg/dL (ref 8.4–10.5)
Chloride: 102 mEq/L (ref 96–112)
Creatinine, Ser: 0.91 mg/dL (ref 0.40–1.20)
GFR: 76.39 mL/min (ref >60.00)
Glucose, Bld: 91 mg/dL (ref 70–99)
Potassium: 4.1 mEq/L (ref 3.5–5.1)
Sodium: 138 mEq/L (ref 135–145)
Total Bilirubin: 0.6 mg/dL (ref 0.2–1.2)
Total Protein: 6.9 g/dL (ref 6.0–8.3)

## 2021-03-22 LAB — CBC WITH DIFFERENTIAL/PLATELET
Basophils Absolute: 0 10*3/uL (ref 0.0–0.1)
Basophils Relative: 0.8 % (ref 0.0–3.0)
Eosinophils Absolute: 0.5 10*3/uL (ref 0.0–0.7)
Eosinophils Relative: 8.3 % — ABNORMAL HIGH (ref 0.0–5.0)
HCT: 40.4 % (ref 36.0–46.0)
Hemoglobin: 13.8 g/dL (ref 12.0–15.0)
Lymphocytes Relative: 33.8 % (ref 12.0–46.0)
Lymphs Abs: 2 10*3/uL (ref 0.7–4.0)
MCHC: 34.2 g/dL (ref 30.0–36.0)
MCV: 84.9 fl (ref 78.0–100.0)
Monocytes Absolute: 0.3 10*3/uL (ref 0.1–1.0)
Monocytes Relative: 5.3 % (ref 3.0–12.0)
Neutro Abs: 3 10*3/uL (ref 1.4–7.7)
Neutrophils Relative %: 51.8 % (ref 43.0–77.0)
Platelets: 174 10*3/uL (ref 150.0–400.0)
RBC: 4.76 Mil/uL (ref 3.87–5.11)
RDW: 12.4 % (ref 11.5–15.5)
WBC: 5.8 10*3/uL (ref 4.0–10.5)

## 2021-03-22 LAB — TSH: TSH: 0.24 u[IU]/mL — ABNORMAL LOW (ref 0.35–5.50)

## 2021-03-22 LAB — HEMOGLOBIN A1C: Hgb A1c MFr Bld: 5.1 % (ref 4.6–6.5)

## 2021-03-23 NOTE — Telephone Encounter (Signed)
Patient informed of the message below.

## 2021-03-23 NOTE — Telephone Encounter (Signed)
Take 1 tablet daily, but 1 day a week take just half tablet. She is slightly over-replaced and this should help her get between previous dosing and current dosing.

## 2021-05-04 ENCOUNTER — Inpatient Hospital Stay: Admission: RE | Admit: 2021-05-04 | Payer: Self-pay | Source: Ambulatory Visit

## 2021-06-28 ENCOUNTER — Other Ambulatory Visit: Payer: Self-pay

## 2021-07-05 ENCOUNTER — Encounter: Payer: Self-pay | Admitting: Radiology

## 2021-07-05 ENCOUNTER — Ambulatory Visit
Admission: RE | Admit: 2021-07-05 | Discharge: 2021-07-05 | Disposition: A | Discharge status: Home / Self Care | Payer: Self-pay | Source: Ambulatory Visit | Attending: Family Medicine | Admitting: Family Medicine

## 2021-07-05 DIAGNOSIS — Z8249 Family history of ischemic heart disease and other diseases of the circulatory system: Secondary | ICD-10-CM

## 2021-09-21 ENCOUNTER — Other Ambulatory Visit: Payer: Self-pay | Admitting: *Deleted

## 2021-09-21 ENCOUNTER — Telehealth: Payer: Self-pay

## 2021-09-21 NOTE — Telephone Encounter (Signed)
Created in error

## 2021-09-24 MED ORDER — LEVOTHYROXINE SODIUM 150 MCG PO TABS: ORAL_TABLET | ORAL | 0 refills | Status: DC

## 2021-09-24 NOTE — Telephone Encounter (Signed)
Ok to refill but she needs a new TSH level and an appointment with me for [facility]

## 2021-12-23 ENCOUNTER — Other Ambulatory Visit: Payer: Self-pay | Admitting: Family Medicine

## 2022-01-19 ENCOUNTER — Other Ambulatory Visit: Payer: Self-pay | Admitting: Family Medicine

## 2022-01-21 NOTE — Telephone Encounter (Signed)
Ok to fill but will need an appt in January 2024 for her visit.

## 2022-02-10 ENCOUNTER — Other Ambulatory Visit: Payer: Self-pay | Admitting: Family Medicine

## 2022-03-23 ENCOUNTER — Other Ambulatory Visit: Payer: Self-pay | Admitting: Family Medicine

## 2022-03-25 NOTE — Telephone Encounter (Signed)
Patient needs appt-- has been >1 year since she has been seen-- ok to fill enough until her appt

## 2022-04-29 ENCOUNTER — Other Ambulatory Visit: Payer: Self-pay | Admitting: Family Medicine

## 2022-04-29 NOTE — Telephone Encounter (Signed)
Hasn't bee seen in >1 year, needs to have an appt-- if she schedules one then ok to give her enough medication until her appointment.

## 2022-05-08 ENCOUNTER — Other Ambulatory Visit: Payer: Self-pay | Admitting: Family Medicine

## 2022-05-10 ENCOUNTER — Telehealth: Payer: Self-pay | Admitting: Family Medicine

## 2022-05-10 ENCOUNTER — Other Ambulatory Visit: Payer: Self-pay

## 2022-05-10 MED ORDER — LEVOTHYROXINE SODIUM 150 MCG PO TABS: ORAL_TABLET | ORAL | 0 refills | Status: DC

## 2022-05-10 NOTE — Telephone Encounter (Signed)
1 month supply sent to [facility].

## 2022-05-10 NOTE — Telephone Encounter (Signed)
Pt has an appt on 06-07-2022 and is out of medication please send levothyroxine (SYNTHROID) 150 MCG tablet  [id number] [place], [place] - [address] Phone: [phone]  Fax: [phone]

## 2022-05-22 ENCOUNTER — Other Ambulatory Visit: Payer: Self-pay | Admitting: Family Medicine

## 2022-05-22 DIAGNOSIS — Z1231 Encounter for screening mammogram for malignant neoplasm of breast: Secondary | ICD-10-CM

## 2022-06-02 IMAGING — CT CT CARDIAC CORONARY ARTERY CALCIUM SCORE
3 series · 14 of 20 positions shown, 16 images · non-contrast
Comparison: None Available.
COMPARISON: None Available.

Addendum:
CLINICAL DATA: This over-read does not include interpretation of
cardiac or coronary anatomy or pathology. The coronary calcium score
interpretation by the cardiologist is attached.
CLINICAL DATA: Cardiovascular Disease Risk stratification

EXAM:
Coronary Calcium Score
TECHNIQUE: A gated, non-contrast computed tomography scan of the heart was
performed using 3mm slice thickness. Axial images were analyzed on a
dedicated workstation. Calcium scoring of the coronary arteries was
performed using the Agatston method.

[Series 2: cascseq 2.0 sa36 70% (id) · axial · 0.39mm/px · z∈[-215,-135]mm · 4 of 68 slices shown]
[im 14/68  vessel]
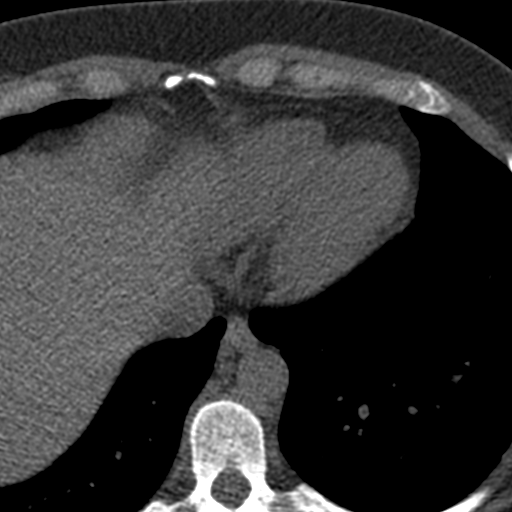
[im 27/68  vessel]
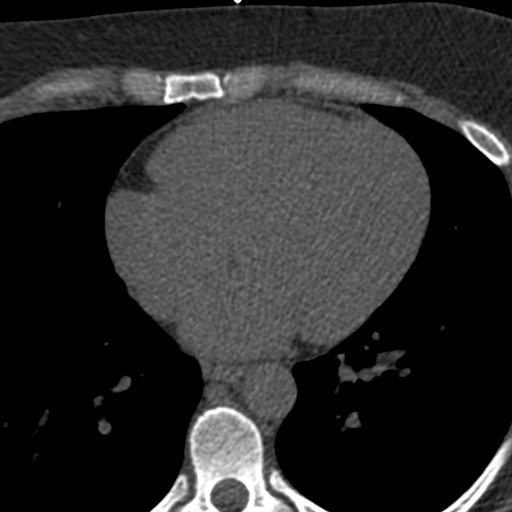
[im 41/68  vessel]
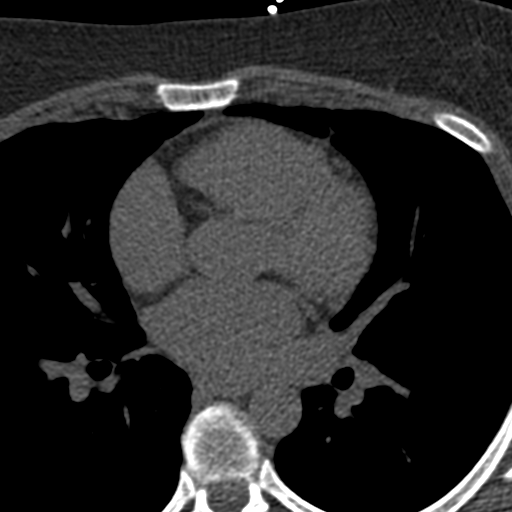
[im 54/68  vessel]
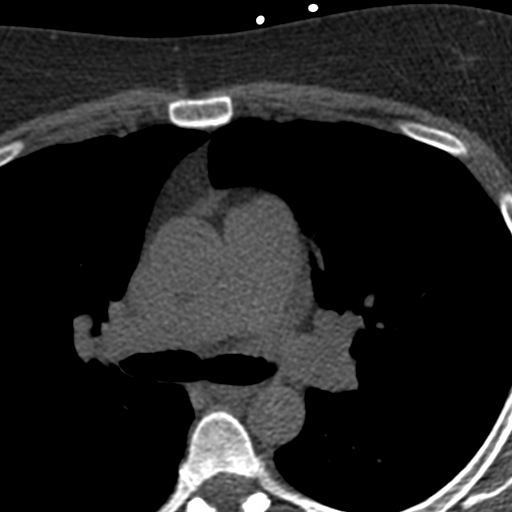

[Series 3: cascseq 2.0 bf37 st · axial · 0.69mm/px · z∈[-219,-131]mm · 5 of 68 slices shown, 7 images]
[im 12/68  vessel]
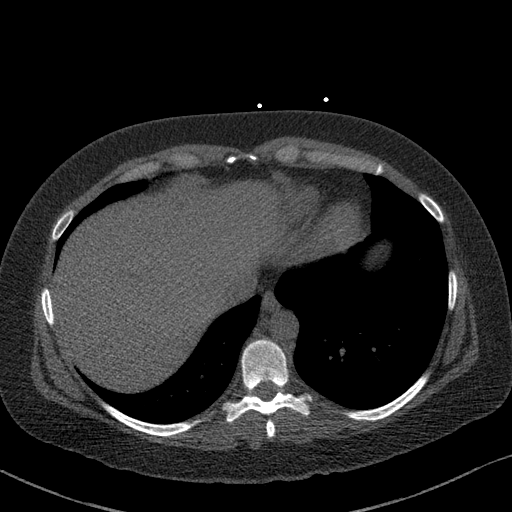
[im 12/68  lung]
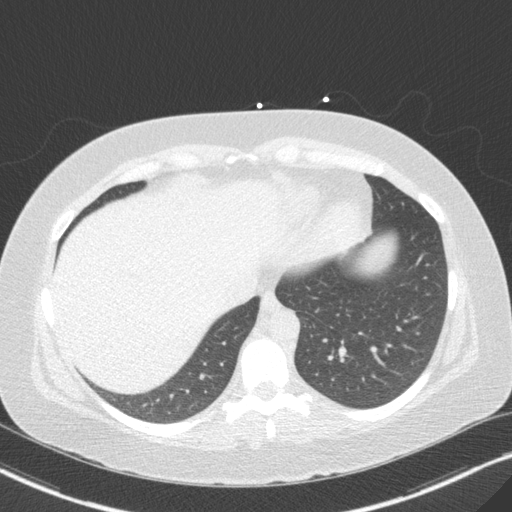
[im 23/68  vessel]
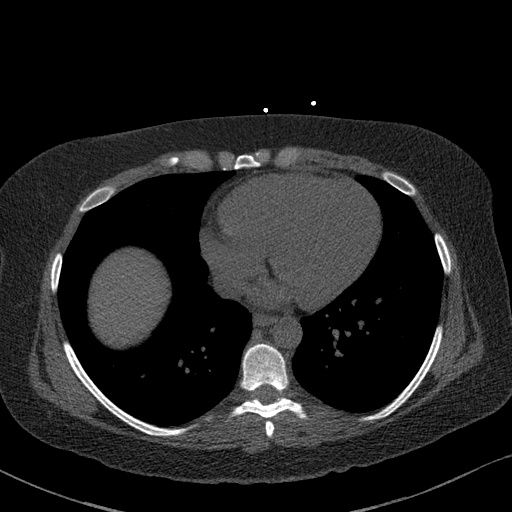
[im 34/68  vessel]
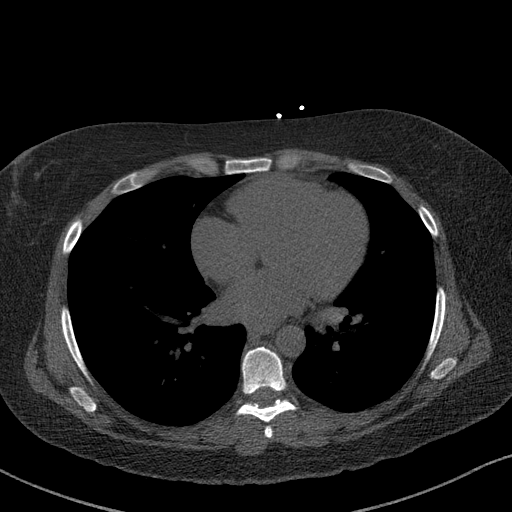
[im 45/68  vessel]
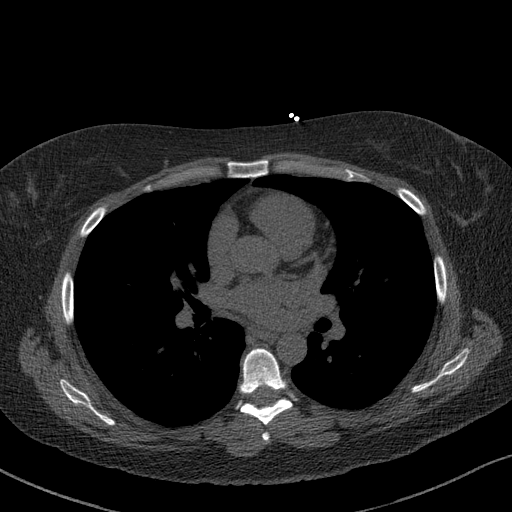
[im 56/68  vessel]
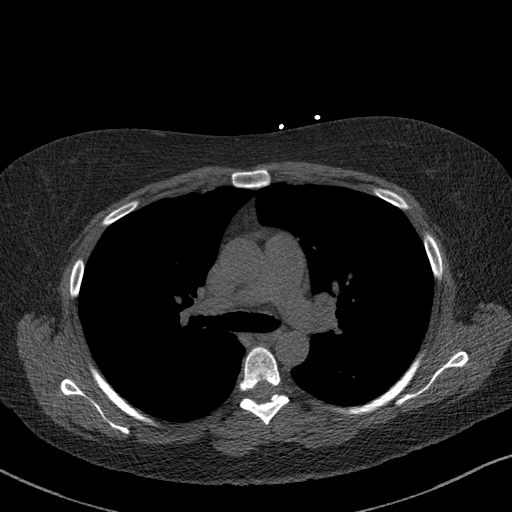
[im 56/68  lung]
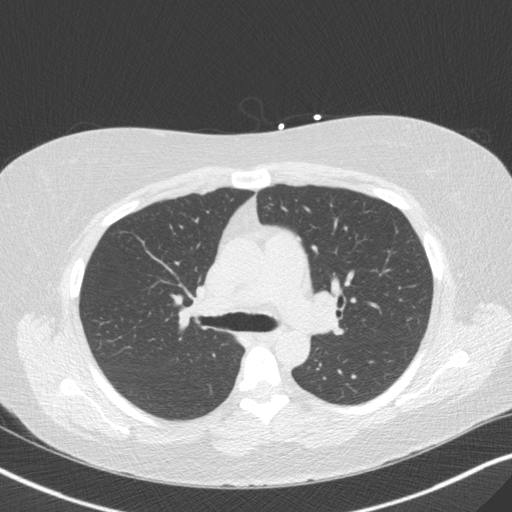

[Series 4: cascseq 2.0 br59 lung · axial · 0.69mm/px · z∈[-219,-131]mm · 5 of 68 slices shown]
[im 12/68  lung]
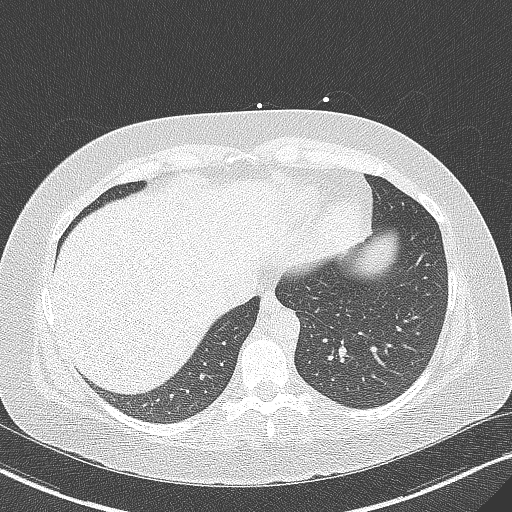
[im 23/68  lung]
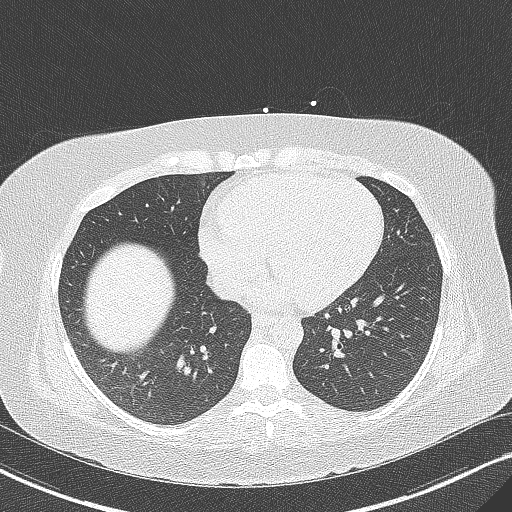
[im 34/68  lung]
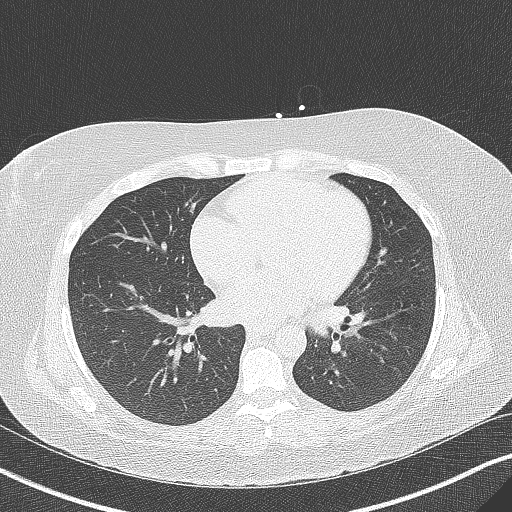
[im 45/68  lung]
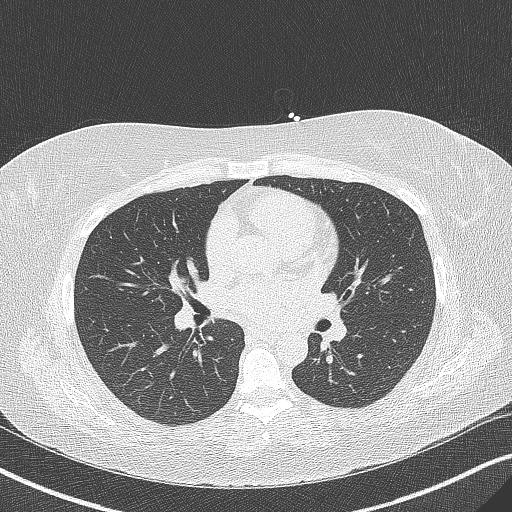
[im 56/68  lung]
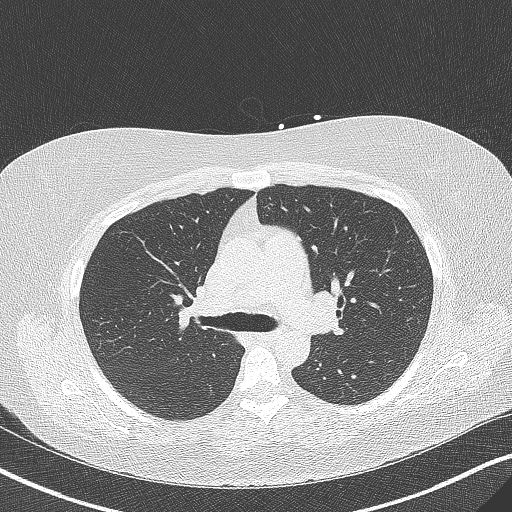

[14 of 20 positions shown; findings below may reference images not displayed]

FINDINGS: Within the visualized portions of the thorax there are no suspicious
appearing pulmonary nodules or masses, there is no acute
consolidative airspace disease, no pleural effusions, no
pneumothorax and no lymphadenopathy. Visualized portions of the
upper abdomen are unremarkable. There are no aggressive appearing
lytic or blastic lesions noted in the visualized portions of the
skeleton.
IMPRESSION: 1. No significant incidental noncardiac findings are noted.
FINDINGS: Coronary arteries: Normal origins.

Coronary Calcium Score:

Left main: 0

Left anterior descending artery: 0

Left circumflex artery: 0

Right coronary artery: 0

Total: 0

Percentile: 0

Pericardium: Normal.

Ascending Aorta: Normal caliber.

Non-cardiac: See separate report from [REDACTED].
IMPRESSION: Coronary calcium score of 0. This was 0 percentile for age-, race-,
and sex-matched controls.



If CAC=0, it is reasonable to withhold statin therapy and reassess
in 5 to 10 years, as long as higher risk conditions are absent
(diabetes mellitus, family history of premature CHD in first degree
relatives (males <55 years; females <65 years), cigarette smoking,
or LDL >=190 mg/dL).

If CAC is 1 to 99, it is reasonable to initiate statin therapy for
patients >=55 years of age.

If CAC is >=100 or >=75th percentile, it is reasonable to initiate
statin therapy at any age.

Cardiology referral should be considered for patients with CAC
scores >=400 or >=75th percentile.

*0295 AHA/ACC/AACVPR/AAPA/ABC/AZHARUL ISLAM/MAYABEL/RUDI/Jetnis/GUFFEY/GAMALIER/BONGWA
Guideline on the Management of Blood Cholesterol: A Report of the
American College of Cardiology/American Heart Association Task Force
on Clinical Practice Guidelines. J Am Coll Cardiol.
5784;73(24):9267-9072.

*** End of Addendum ***
FINDINGS: Within the visualized portions of the thorax there are no suspicious
appearing pulmonary nodules or masses, there is no acute
consolidative airspace disease, no pleural effusions, no
pneumothorax and no lymphadenopathy. Visualized portions of the
upper abdomen are unremarkable. There are no aggressive appearing
lytic or blastic lesions noted in the visualized portions of the
skeleton.
IMPRESSION: 1. No significant incidental noncardiac findings are noted.

## 2022-06-03 ENCOUNTER — Other Ambulatory Visit: Payer: Self-pay | Admitting: Family Medicine

## 2022-06-05 ENCOUNTER — Inpatient Hospital Stay: Admission: RE | Admit: 2022-06-05 | Payer: Commercial Managed Care - PPO | Source: Ambulatory Visit

## 2022-06-07 ENCOUNTER — Encounter: Payer: Commercial Managed Care - PPO | Admitting: Family Medicine

## 2022-06-12 ENCOUNTER — Ambulatory Visit: Payer: Commercial Managed Care - PPO | Admitting: Family Medicine

## 2022-06-12 ENCOUNTER — Encounter: Payer: Self-pay | Admitting: Family Medicine

## 2022-06-12 VITALS — BP 100/78 | HR 62 | Temp 98.3°F | Ht 62.0 in | Wt 202.5 lb

## 2022-06-12 DIAGNOSIS — G4709 Other insomnia: Secondary | ICD-10-CM

## 2022-06-12 DIAGNOSIS — G4700 Insomnia, unspecified: Secondary | ICD-10-CM | POA: Insufficient documentation

## 2022-06-12 DIAGNOSIS — E039 Hypothyroidism, unspecified: Secondary | ICD-10-CM | POA: Diagnosis not present

## 2022-06-12 DIAGNOSIS — Z1211 Encounter for screening for malignant neoplasm of colon: Secondary | ICD-10-CM

## 2022-06-12 DIAGNOSIS — E669 Obesity, unspecified: Secondary | ICD-10-CM

## 2022-06-12 DIAGNOSIS — F419 Anxiety disorder, unspecified: Secondary | ICD-10-CM

## 2022-06-12 DIAGNOSIS — Z13220 Encounter for screening for lipoid disorders: Secondary | ICD-10-CM

## 2022-06-12 MED ORDER — PHENTERMINE HCL 15 MG PO CAPS
15.0000 mg | ORAL_CAPSULE | ORAL | 0 refills | Status: DC
Start: 2022-06-12 — End: 2022-10-15

## 2022-06-12 MED ORDER — LEVOTHYROXINE SODIUM 150 MCG PO TABS: ORAL_TABLET | ORAL | 1 refills | Status: DC

## 2022-06-12 NOTE — Progress Notes (Signed)
Established Patient Office Visit  Subjective   Patient ID: Tara Snyder, female    DOB: May 05, 1975  Age: 47 y.o. MRN: [id number]  Chief Complaint  Patient presents with   Establish Care   Insomnia    X6 months and questioned if this could be due to perimenopause   Anxiety    Patient states she has felt increased anxiety ongoing for years and requests medication at this time    Patient is here for [facility] visit.   Insomnia-- she reports that ever since she went through menopause she started having issues with sleep. States that she is waking up in the middle of the night. She is ok to fall asleep, but wakes up around 2:30 am and cannot go back to sleep until around 5 am, wakes up again around 6-6:30. States that it is more frequent, states that it is happening almost every night now.   Anxiety- pt reports she went through fairly intense therapy and feels that more of the time her emotions are well regulated. States that she is usually able to control it, but she does have breakthrough symptoms about once a week. No other mood swings, no anhedonia, no crying spells, thinks that she has a good understanding of her triggers and has good coping skills.  Weight gain-- pt reports that she has been gaining weight for the past several years, states that she usually runs around 150 pounds, hasn't weighed that for about 5 years.   Current Outpatient Medications  Medication Instructions   levothyroxine (SYNTHROID) 150 MCG tablet 1 tablet daily.   phentermine 15 mg, Oral, BH-each morning     Patient Active Problem List   Diagnosis Date Noted   Insomnia disorder 06/12/2022   Anxiety 06/12/2022   Obesity (BMI 35.0-39.9 without comorbidity) 06/12/2022   Hypothyroid 03/21/2021   COVID-19 virus detected 09/02/2018      Review of Systems  All other systems reviewed and are negative.     Objective:     BP 100/78 (BP Location: Left Arm, Patient Position: Sitting, Cuff Size: Large)   Pulse  62   Temp 98.3 F (36.8 C) (Oral)   Ht  (1.575 m)   Wt 202 lb 8 oz (91.9 kg)   LMP 06/11/2020 (Approximate)   SpO2 100%   BMI 37.04 kg/m    Physical Exam Vitals reviewed.  Constitutional:      Appearance: Normal appearance. She is well-groomed and normal weight.  Eyes:     Conjunctiva/sclera: Conjunctivae normal.  Neck:     Thyroid: No thyromegaly.  Cardiovascular:     Rate and Rhythm: Normal rate and regular rhythm.     Pulses: Normal pulses.     Heart sounds: S1 normal and S2 normal.  Pulmonary:     Effort: Pulmonary effort is normal.     Breath sounds: Normal breath sounds and air entry.  Abdominal:     General: Bowel sounds are normal.  Musculoskeletal:     Right lower leg: No edema.     Left lower leg: No edema.  Neurological:     Mental Status: She is alert and oriented to person, place, and time. Mental status is at baseline.     Gait: Gait is intact.  Psychiatric:        Mood and Affect: Mood and affect normal.        Speech: Speech normal.        Behavior: Behavior normal.  Judgment: Judgment normal.      No results found for any visits on 06/12/22.    The 10-year ASCVD risk score (Arnett DK, et al., 2019) is: 0.5%    Assessment & Plan:   Problem List Items Addressed This Visit       Unprioritized   Hypothyroid - Primary    On 150 mcg daily of levothyroxine, will check new TSH today      Relevant Medications   levothyroxine (SYNTHROID) 150 MCG tablet   Other Relevant Orders   TSH   Insomnia disorder    Pt wants to try using OTC medications first, I recommended starting with melatonin  at bedtime, may take up to 20 mg at bedtime, or Tylenol PM which contains antihistamines. RTC in 3 months for follow up      Anxiety    Pt seems to have good coping skills and techniques to use when she feels overwhelmed. I think that if we correct her insomnia then her anxiety will improve during the day.       Relevant Orders   CMP   CBC  with Differential/Platelets   Obesity (BMI 35.0-39.9 without comorbidity)    I have had an extensive 30 minute conversation today with the patient about healthy eating habits, exercise, calorie and carb goals for sustainable and successful weight loss. I gave the patient caloric and protein daily intake values as well as described the importance of increasing fiber and water intake. I discussed weight loss medications that could be used in the treatment of this patient. Will start phentermine 15 mg capsules daily to help reduce appetite, we discussed the risks/benefits of this medication. Handouts on low carb eating were given to the patient.  The list of goal I gave the patient are listed below.  Total caloric intake: 1800 per day  Total protein intake: try to get 100 grams per day  Total carbohydrate intake: 90 grams or less per day  Fiber: 30-35 gram per day  Apps are available in the app store to help you keep track of your macronutrients and calories  Call your HR department-- ask if they cover Wegovy, Saxenda or Zepbound      Relevant Medications   phentermine 15 MG capsule   Other Visit Diagnoses     Lipid screening       Relevant Orders   Lipid Panel   Colon cancer screening       Relevant Orders   Cologuard       Return in about 3 months (around 09/11/2022) for weight loss, can be video vist.    Karie Georges, MD

## 2022-06-12 NOTE — Assessment & Plan Note (Signed)
On 150 mcg daily of levothyroxine, will check new TSH today

## 2022-06-12 NOTE — Assessment & Plan Note (Signed)
Pt wants to try using OTC medications first, I recommended starting with melatonin  at bedtime, may take up to 20 mg at bedtime, or Tylenol PM which contains antihistamines. RTC in 3 months for follow up

## 2022-06-12 NOTE — Assessment & Plan Note (Signed)
I have had an extensive 30 minute conversation today with the patient about healthy eating habits, exercise, calorie and carb goals for sustainable and successful weight loss. I gave the patient caloric and protein daily intake values as well as described the importance of increasing fiber and water intake. I discussed weight loss medications that could be used in the treatment of this patient. Will start phentermine 15 mg capsules daily to help reduce appetite, we discussed the risks/benefits of this medication. Handouts on low carb eating were given to the patient.  The list of goal I gave the patient are listed below.  Total caloric intake: 1800 per day  Total protein intake: try to get 100 grams per day  Total carbohydrate intake: 90 grams or less per day  Fiber: 30-35 gram per day  Apps are available in the app store to help you keep track of your macronutrients and calories  Call your HR department-- ask if they cover Wegovy, Saxenda or Zepbound

## 2022-06-12 NOTE — Assessment & Plan Note (Signed)
Pt seems to have good coping skills and techniques to use when she feels overwhelmed. I think that if we correct her insomnia then her anxiety will improve during the day.

## 2022-06-12 NOTE — Patient Instructions (Addendum)
Total caloric intake: 1800 per day  Total protein intake: try to get 100 grams per day  Total carbohydrate intake: 90 grams or less per day  Fiber: 30-35 gram per day  Apps are available in the app store to help you keep track of your macronutrients and calories  Call your HR department-- ask if they cover Wegovy, Saxenda or Zepbound  Insomnia-- melatonin 5-20 mg at bedtime or using OTC antihistamines like benadryl or tylenol PM.

## 2022-06-13 ENCOUNTER — Other Ambulatory Visit: Payer: Commercial Managed Care - PPO

## 2022-06-19 ENCOUNTER — Other Ambulatory Visit (INDEPENDENT_AMBULATORY_CARE_PROVIDER_SITE_OTHER): Payer: Commercial Managed Care - PPO

## 2022-06-19 DIAGNOSIS — E039 Hypothyroidism, unspecified: Secondary | ICD-10-CM

## 2022-06-19 DIAGNOSIS — Z13220 Encounter for screening for lipoid disorders: Secondary | ICD-10-CM

## 2022-06-19 DIAGNOSIS — F419 Anxiety disorder, unspecified: Secondary | ICD-10-CM | POA: Diagnosis not present

## 2022-06-19 LAB — CBC WITH DIFFERENTIAL/PLATELET
Basophils Absolute: 0.1 10*3/uL (ref 0.0–0.1)
Basophils Relative: 0.7 % (ref 0.0–3.0)
Eosinophils Absolute: 0.5 10*3/uL (ref 0.0–0.7)
Eosinophils Relative: 7 % — ABNORMAL HIGH (ref 0.0–5.0)
HCT: 41.6 % (ref 36.0–46.0)
Hemoglobin: 14.4 g/dL (ref 12.0–15.0)
Lymphocytes Relative: 30.2 % (ref 12.0–46.0)
Lymphs Abs: 2.3 10*3/uL (ref 0.7–4.0)
MCHC: 34.6 g/dL (ref 30.0–36.0)
MCV: 83.9 fl (ref 78.0–100.0)
Monocytes Absolute: 0.5 10*3/uL (ref 0.1–1.0)
Monocytes Relative: 6.1 % (ref 3.0–12.0)
Neutro Abs: 4.2 10*3/uL (ref 1.4–7.7)
Neutrophils Relative %: 56 % (ref 43.0–77.0)
Platelets: 186 10*3/uL (ref 150.0–400.0)
RBC: 4.95 Mil/uL (ref 3.87–5.11)
RDW: 12.7 % (ref 11.5–15.5)
WBC: 7.5 10*3/uL (ref 4.0–10.5)

## 2022-06-19 LAB — COMPREHENSIVE METABOLIC PANEL
ALT: 15 U/L (ref 0–35)
AST: 14 U/L (ref 0–37)
Albumin: 4.4 g/dL (ref 3.5–5.2)
Alkaline Phosphatase: 86 U/L (ref 39–117)
BUN: 21 mg/dL (ref 6–23)
CO2: 28 mEq/L (ref 19–32)
Calcium: 9.7 mg/dL (ref 8.4–10.5)
Chloride: 103 mEq/L (ref 96–112)
Creatinine, Ser: 0.95 mg/dL (ref 0.40–1.20)
GFR: 71.91 mL/min (ref >60.00)
Glucose, Bld: 96 mg/dL (ref 70–99)
Potassium: 4.6 mEq/L (ref 3.5–5.1)
Sodium: 140 mEq/L (ref 135–145)
Total Bilirubin: 0.5 mg/dL (ref 0.2–1.2)
Total Protein: 6.6 g/dL (ref 6.0–8.3)

## 2022-06-19 LAB — LIPID PANEL
Cholesterol: 182 mg/dL (ref 0–200)
HDL: 50.8 mg/dL (ref >39.00)
LDL Cholesterol: 115 mg/dL — ABNORMAL HIGH (ref 0–99)
NonHDL: 131.06
Total CHOL/HDL Ratio: 4
Triglycerides: 81 mg/dL (ref 0.0–149.0)
VLDL: 16.2 mg/dL (ref 0.0–40.0)

## 2022-06-19 LAB — TSH: TSH: 0.31 u[IU]/mL — ABNORMAL LOW (ref 0.35–5.50)

## 2022-06-19 MED ORDER — LEVOTHYROXINE SODIUM 125 MCG PO TABS
125.0000 ug | ORAL_TABLET | Freq: Every day | ORAL | 1 refills | Status: DC
Start: 2022-06-19 — End: 2023-04-18

## 2022-07-17 ENCOUNTER — Ambulatory Visit
Admission: RE | Admit: 2022-07-17 | Discharge: 2022-07-17 | Disposition: A | Discharge status: Home / Self Care | Payer: Commercial Managed Care - PPO | Source: Ambulatory Visit | Attending: Family Medicine | Admitting: Family Medicine

## 2022-07-17 DIAGNOSIS — Z1231 Encounter for screening mammogram for malignant neoplasm of breast: Secondary | ICD-10-CM

## 2022-07-19 LAB — COLOGUARD

## 2022-09-11 ENCOUNTER — Telehealth: Payer: Commercial Managed Care - PPO | Admitting: Family Medicine

## 2022-10-15 ENCOUNTER — Telehealth: Payer: Commercial Managed Care - PPO | Admitting: Family Medicine

## 2022-10-15 ENCOUNTER — Encounter: Payer: Self-pay | Admitting: Family Medicine

## 2022-10-15 DIAGNOSIS — E669 Obesity, unspecified: Secondary | ICD-10-CM

## 2022-10-15 DIAGNOSIS — E039 Hypothyroidism, unspecified: Secondary | ICD-10-CM | POA: Diagnosis not present

## 2022-10-15 MED ORDER — ZEPBOUND 2.5 MG/0.5ML ~~LOC~~ SOAJ
2.5000 mg | SUBCUTANEOUS | 0 refills | Status: DC
Start: 2022-10-15 — End: 2023-01-09

## 2022-10-15 NOTE — Progress Notes (Signed)
Virtual Medical Office Visit  Patient:  Tara Snyder      Age: 47 y.o.       Sex:  female  Date:   10/15/2022  PCP:    Karie Georges, MD   Today's Healthcare Provider: Karie Georges, MD    Assessment/Plan:   Summary assessment:  Tara Snyder was seen today for medical management of chronic issues.  Hypothyroidism, unspecified type Assessment & Plan: We reduced her levothyroxine to 125 mcg after the last TSH. She needs a repeat TSH to check the new dose. TSH has been ordered.  Orders: -     TSH; Future  Obesity (BMI 35.0-39.9 without comorbidity) Assessment & Plan: Patient had adverse side effects to the phentermine, she is currently using calorie and carb restriction and is not losing weight. I believe that the patient is an excellent candidate for GLP medication. I will start the patient on Zepbound 2.5 mg weekly. I will see her back in 3 months in the office for a weight check. We will titrate up the dose monthly.   She will continue the current dietary plan that I gave her 4 months ago.   Orders: -     Zepbound; Inject 2.5 mg into the skin once a week.  Dispense: 2 mL; Refill: 0     Return in about 3 months (around 01/15/2023) for weight loss.   She was advised to call the office or go to ER if her condition worsens    Subjective:   Tara Snyder is a 47 y.o. female with PMH significant for: Past Medical History:  Diagnosis Date   HUS (hemolytic uremic syndrome) (HCC) 1987   Hypothyroid    Shingles    Shoulder dislocation, recurrent, right      Presenting today with: Chief Complaint  Patient presents with   Medical Management of Chronic Issues     She clarifies and reports that her condition: Patient reports she had side effects with the phentermine, states that it interfered with sleep, states that it kinda helped with her appetite but she was having too much trouble with sleep. States that she is trying to stick to the diet but is having difficulty  managing her hunger. Patient has not had her repeat TSH checked. States that she is doing well on the reduced dose of levothyroxine, needs new TSH checked.  She denies having any: Heart palpitations, anxiety, sweating, etc.       Current Outpatient Medications  Medication Instructions   levothyroxine (SYNTHROID) 125 mcg, Oral, Daily   Zepbound 2.5 mg, Subcutaneous, Weekly       Objective/Observations  Physical Exam:  Polite and friendly Gen: NAD, resting comfortably Pulm: Normal work of breathing Neuro: Grossly normal, moves all extremities Psych: Normal affect and thought content Problem specific physical exam findings:    No images are attached to the encounter or orders placed in the encounter.    Results: No results found for any visits on 10/15/22.        Virtual Visit via Video   I connected with Tara Snyder on 10/15/22 at  4:30 PM EDT by a video enabled telemedicine application and verified that I am speaking with the correct person using two identifiers. The limitations of evaluation and management by telemedicine and the availability of in person appointments were discussed. The patient expressed understanding and agreed to proceed.   Percentage of appointment time on video:  100% Patient location: Home Provider location: [employer]  Office Persons participating in the virtual visit: Myself and Patient

## 2022-10-15 NOTE — Assessment & Plan Note (Signed)
We reduced her levothyroxine to 125 mcg after the last TSH. She needs a repeat TSH to check the new dose. TSH has been ordered.

## 2022-10-15 NOTE — Assessment & Plan Note (Signed)
Patient had adverse side effects to the phentermine, she is currently using calorie and carb restriction and is not losing weight. I believe that the patient is an excellent candidate for GLP medication. I will start the patient on Zepbound 2.5 mg weekly. I will see her back in 3 months in the office for a weight check. We will titrate up the dose monthly.   She will continue the current dietary plan that I gave her 4 months ago.

## 2022-10-24 ENCOUNTER — Telehealth: Payer: Self-pay

## 2022-10-24 ENCOUNTER — Other Ambulatory Visit (HOSPITAL_COMMUNITY): Payer: Self-pay

## 2022-10-24 NOTE — Telephone Encounter (Signed)
Pharmacy Patient Advocate Encounter   Received notification from CoverMyMeds that prior authorization for Zepbound 2.5mg /0.50ml is required/requested.   Insurance verification completed.   The patient is insured through [facility] .   Per test claim: PA required; PA submitted to [facility] via CoverMyMeds Key/confirmation #/EOC [id number] Status is pending

## 2022-10-25 NOTE — Telephone Encounter (Signed)
Pharmacy Patient Advocate Encounter  Received notification from [facility] that Prior Authorization for Zepbound 2.5mg /0.44ml has been APPROVED from 10/24/22 to 06/21/23   PA #/Case ID/Reference #: [id number]

## 2022-10-29 NOTE — Telephone Encounter (Signed)
Spoke with Beverely Low, pharmacist and informed her of the approval as below.

## 2023-01-09 ENCOUNTER — Other Ambulatory Visit: Payer: Self-pay | Admitting: Family Medicine

## 2023-01-09 DIAGNOSIS — E669 Obesity, unspecified: Secondary | ICD-10-CM

## 2023-02-10 ENCOUNTER — Telehealth: Payer: Commercial Managed Care - PPO | Admitting: Family Medicine

## 2023-02-10 DIAGNOSIS — E669 Obesity, unspecified: Secondary | ICD-10-CM

## 2023-02-10 DIAGNOSIS — T887XXA Unspecified adverse effect of drug or medicament, initial encounter: Secondary | ICD-10-CM

## 2023-02-10 DIAGNOSIS — R197 Diarrhea, unspecified: Secondary | ICD-10-CM | POA: Diagnosis not present

## 2023-02-10 NOTE — Progress Notes (Signed)
Virtual Visit Consent   Tara Snyder, you are scheduled for a virtual visit with a  provider today. Just as with appointments in the office, your consent must be obtained to participate. Your consent will be active for this visit and any virtual visit you may have with one of our providers in the next 365 days. If you have a MyChart account, a copy of this consent can be sent to you electronically.  As this is a virtual visit, video technology does not allow for your provider to perform a traditional examination. This may limit your provider's ability to fully assess your condition. If your provider identifies any concerns that need to be evaluated in person or the need to arrange testing (such as labs, EKG, etc.), we will make arrangements to do so. Although advances in technology are sophisticated, we cannot ensure that it will always work on either your end or our end. If the connection with a video visit is poor, the visit may have to be switched to a telephone visit. With either a video or telephone visit, we are not always able to ensure that we have a secure connection.  By engaging in this virtual visit, you consent to the provision of healthcare and authorize for your insurance to be billed (if applicable) for the services provided during this visit. Depending on your insurance coverage, you may receive a charge related to this service.  I need to obtain your verbal consent now. Are you willing to proceed with your visit today? Jazia A Tegtmeier has provided verbal consent on 02/10/2023 for a virtual visit (video or telephone). Freddy Finner, NP  Date: 02/10/2023 9:37 AM  Virtual Visit via Video Note   I, Freddy Finner, connected with  Tara Snyder  ([id number], 1975-07-08) on 02/10/23 at  9:45 AM EST by a video-enabled telemedicine application and verified that I am speaking with the correct person using two identifiers.  Location: Patient: Virtual Visit Location Patient:  Home Provider: Virtual Visit Location Provider: Home Office   I discussed the limitations of evaluation and management by telemedicine and the availability of in person appointments. The patient expressed understanding and agreed to proceed.    History of Present Illness: Tara Snyder is a 47 y.o. who identifies as a female who was assigned female at birth, and is being seen today for stomach upset.  Friday- noted to have diarrhea starting and was going almost hourly over the weekend. She tried imodium once to help rest, it improved some. Still unable to eat without trouble. But reports reflux with acid building up. Denies n/v, fevers chills  Of note she is on Tirzepatide 5mg , and she was taking half doses and took the full dose the Wednesday prior to the diarrhea.  Has not been around others with a stomach bug, does not have other symptoms, so she was unsure if this was related to the medication or not.   Problems:  Patient Active Problem List   Diagnosis Date Noted   Insomnia disorder 06/12/2022   Anxiety 06/12/2022   Obesity (BMI 35.0-39.9 without comorbidity) 06/12/2022   Hypothyroid 03/21/2021   COVID-19 virus detected 09/02/2018    Allergies: No Known Allergies Medications:  Current Outpatient Medications:    levothyroxine (SYNTHROID) 125 MCG tablet, Take 1 tablet (125 mcg total) by mouth daily., Disp: 90 tablet, Rfl: 1   tirzepatide 5 MG/0.5ML injection vial, Inject 5 mg into the skin once a week., Disp: 2 mL, Rfl: 0  Observations/Objective: Patient is well-developed, well-nourished in no acute distress.  Resting comfortably at home.  Head is normocephalic, atraumatic.  No labored breathing.  Speech is clear and coherent with logical content.  Patient is alert and oriented at baseline.    Assessment and Plan:  1. Diarrhea, unspecified type (Primary)   2. Obesity (BMI 35.0-39.9 without comorbidity)   3. Medication side effect   -blant diet is encouraged, info  on AVS -OTC discussed -hydration -follow up with PCP on medication    Reviewed side effects, risks and benefits of medication.    Patient acknowledged agreement and understanding of the plan.   Past Medical, Surgical, Social History, Allergies, and Medications have been Reviewed.     Follow Up Instructions: I discussed the assessment and treatment plan with the patient. The patient was provided an opportunity to ask questions and all were answered. The patient agreed with the plan and demonstrated an understanding of the instructions.  A copy of instructions were sent to the patient via MyChart unless otherwise noted below.    The patient was advised to call back or seek an in-person evaluation if the symptoms worsen or if the condition fails to improve as anticipated.    Freddy Finner, NP

## 2023-02-10 NOTE — Patient Instructions (Signed)
  Tara Snyder, thank you for joining Freddy Finner, NP for today's virtual visit.  While this provider is not your primary care provider (PCP), if your PCP is located in our provider database this encounter information will be shared with them immediately following your visit.   A [facility] MyChart account gives you access to today's visit and all your visits, tests, and labs performed at [facility] " click here if you don't have a [facility] MyChart account or go to [email or web address]  Consent: (Patient) Tara Snyder provided verbal consent for this virtual visit at the beginning of the encounter.  Current Medications:  Current Outpatient Medications:    levothyroxine (SYNTHROID) 125 MCG tablet, Take 1 tablet (125 mcg total) by mouth daily., Disp: 90 tablet, Rfl: 1   tirzepatide 5 MG/0.5ML injection vial, Inject 5 mg into the skin once a week., Disp: 2 mL, Rfl: 0   Medications ordered in this encounter:  No orders of the defined types were placed in this encounter.    *If you need refills on other medications prior to your next appointment, please contact your pharmacy*  Follow-Up: Call back or seek an in-person evaluation if the symptoms worsen or if the condition fails to improve as anticipated.  [facility] [phone]  Other Instructions  First 24 Hours-Clear liquids             popsicles             Jello             gatorade             Sprite Ginger ale Second 24 hours-Add Full liquids ( Liquids you can not see through) Third 24 hours- Bland diet ( foods that are baked or broiled)             *avoiding fried foods and highly spiced foods* During these 3 days             Avoid milk, cheese, ice cream or any other dairy products             Avoid caffeine- REMEMBER most soft drinks and sodas have caffeine which can upset your stomach. You should eat and drink in frequent small volumes Immodium AD OTC for diarrhea If no  improvement in symptoms or worsen in 2-3 days you need to be seen in person.   If you have been instructed to have an in-person evaluation today at a local Urgent Care facility, please use the link below. It will take you to a list of all of our available [facility], including address, phone number and hours of operation. Please do not delay care.  [facility]  If you or a family member do not have a primary care provider, use the link below to schedule a visit and establish care. When you choose a [facility] primary care physician or advanced practice provider, you gain a long-term partner in health. Find a Primary Care Provider  Learn more about [facility]'s in-office and virtual care options: [facility] - Get Care Now

## 2023-03-06 ENCOUNTER — Telehealth (INDEPENDENT_AMBULATORY_CARE_PROVIDER_SITE_OTHER): Payer: Commercial Managed Care - PPO | Admitting: Adult Health

## 2023-03-06 ENCOUNTER — Encounter: Payer: Self-pay | Admitting: Adult Health

## 2023-03-06 VITALS — Ht 62.0 in | Wt 202.0 lb

## 2023-03-06 DIAGNOSIS — E669 Obesity, unspecified: Secondary | ICD-10-CM | POA: Diagnosis not present

## 2023-03-06 DIAGNOSIS — Z6835 Body mass index (BMI) 35.0-35.9, adult: Secondary | ICD-10-CM

## 2023-03-06 MED ORDER — TIRZEPATIDE-WEIGHT MANAGEMENT 2.5 MG/0.5ML ~~LOC~~ SOLN
2.5000 mg | SUBCUTANEOUS | 0 refills | Status: AC
Start: 2023-03-06 — End: 2023-04-05

## 2023-03-06 NOTE — Progress Notes (Signed)
 Virtual Visit via Video Note  I connected with Tara Snyder  on 03/06/23 at  7:00 AM EST by a video enabled telemedicine application and verified that I am speaking with the correct person using two identifiers.  Location patient: home Location provider:work or home office Persons participating in the virtual visit: patient, provider  I discussed the limitations of evaluation and management by telemedicine and the availability of in person appointments. The patient expressed understanding and agreed to proceed.   HPI: Tara Snyder is a 48 year old female, patient of Dr. Ozell who I am seeing today in her absence.  She is currently working on weight loss with Zepbound  and counseling.  Prior to Christmas she was titrated up from 2.5 mg to 5 mg a Zepbound  and noticed side effects such as increased acid reflux and nausea so she stopped taking Zepbound .  It sounds like she also got hit with a stomach flu and developed diarrhea but her defect started prior to her GI illness.  She would like to go back on the 2.5 mg of Zepbound  and titrate slower.   ROS: See pertinent positives and negatives per HPI.  Past Medical History:  Diagnosis Date   HUS (hemolytic uremic syndrome) (HCC) 1987   Hypothyroid    Shingles    Shoulder dislocation, recurrent, right     Past Surgical History:  Procedure Laterality Date   right shoulder  2017   recurrent dislocation right shoulder; surgery for stabilization    Family History  Problem Relation Age of Onset   High Cholesterol Mother    Diabetes Mother    Heart attack Father 17   Heart disease Father    High Cholesterol Father    High blood pressure Father    Heart failure Father    High Cholesterol Brother    Cancer Maternal Grandmother        blood   Other Maternal Grandmother        skin issues; multiple   Diabetes Maternal Grandfather        insulin depdt   High blood pressure Paternal Grandmother    High Cholesterol Paternal Grandmother    Stroke  Paternal Grandmother 63   Obesity Paternal Grandmother    Hearing loss Paternal Grandfather    Heart attack Paternal Grandfather 81   Heart disease Paternal Grandfather        Current Outpatient Medications:    levothyroxine  (SYNTHROID ) 125 MCG tablet, Take 1 tablet (125 mcg total) by mouth daily., Disp: 90 tablet, Rfl: 1   tirzepatide  5 MG/0.5ML injection vial, Inject 5 mg into the skin once a week., Disp: 2 mL, Rfl: 0  EXAM:  VITALS per patient if applicable:  GENERAL: alert, oriented, appears well and in no acute distress  HEENT: atraumatic, conjunttiva clear, no obvious abnormalities on inspection of external nose and ears  NECK: normal movements of the head and neck  LUNGS: on inspection no signs of respiratory distress, breathing rate appears normal, no obvious gross SOB, gasping or wheezing  CV: no obvious cyanosis  MS: moves all visible extremities without noticeable abnormality  PSYCH/NEURO: pleasant and cooperative, no obvious depression or anxiety, speech and thought processing grossly intact  ASSESSMENT AND PLAN:  Discussed the following assessment and plan: 1. Obesity (BMI 35.0-39.9 without comorbidity) (Primary) -Will send in Zepbound  2.5 mg weekly for 1 month.  After that follow-up with her PCP to develop a plan for titration going further. - tirzepatide  (ZEPBOUND ) 2.5 MG/0.5ML injection vial; Inject 2.5 mg into the  skin once a week.  Dispense: 2 mL; Refill: 0      I discussed the assessment and treatment plan with the patient. The patient was provided an opportunity to ask questions and all were answered. The patient agreed with the plan and demonstrated an understanding of the instructions.   The patient was advised to call back or seek an in-person evaluation if the symptoms worsen or if the condition fails to improve as anticipated.   Lamberto Dinapoli, NP

## 2023-03-28 ENCOUNTER — Ambulatory Visit: Payer: Commercial Managed Care - PPO | Admitting: Family Medicine

## 2023-04-05 ENCOUNTER — Other Ambulatory Visit: Payer: Self-pay | Admitting: Family Medicine

## 2023-04-05 DIAGNOSIS — E669 Obesity, unspecified: Secondary | ICD-10-CM

## 2023-04-06 ENCOUNTER — Other Ambulatory Visit: Payer: Self-pay | Admitting: Family Medicine

## 2023-04-06 DIAGNOSIS — E039 Hypothyroidism, unspecified: Secondary | ICD-10-CM

## 2023-04-07 NOTE — Telephone Encounter (Signed)
 Left a message for the patient to return my call.

## 2023-04-07 NOTE — Telephone Encounter (Signed)
 We had reduced her levothyroxine  to 125 mcg, she needs a new TSH -- please have patient come in for a lab appointment and then we will decide which dose she needs. Thanks!

## 2023-04-14 ENCOUNTER — Ambulatory Visit: Payer: Self-pay | Admitting: Family Medicine

## 2023-04-14 ENCOUNTER — Ambulatory Visit (INDEPENDENT_AMBULATORY_CARE_PROVIDER_SITE_OTHER): Payer: Commercial Managed Care - PPO | Admitting: Family Medicine

## 2023-04-14 ENCOUNTER — Encounter: Payer: Self-pay | Admitting: Family Medicine

## 2023-04-14 VITALS — BP 110/70 | HR 76 | Temp 98.0°F

## 2023-04-14 DIAGNOSIS — S93104D Unspecified dislocation of right toe(s), subsequent encounter: Secondary | ICD-10-CM

## 2023-04-14 NOTE — Telephone Encounter (Signed)
Chief Complaint: toe pain Symptoms: toe pain, bruising, numbness Frequency: injury Friday PM Pertinent Negatives: Patient denies signs of infection Disposition: [] ED /[] Urgent Care (no appt availability in office) / [x] Appointment(In office/virtual)/ []  [facility]/ [] Home Care/ [] Refused Recommended Disposition /[] [facility]/ []  Follow-up with PCP Additional Notes: Pt dislocated her L pinky toe while playing with her dog Friday PM. Pt was seen at [facility] and the toe was reset. Pt wearing a splint today and elevating that foot. Pt reports 3-4 pain, worse with walking. Endorses swelling and bruising but states bruising is located. Endorses some numbness to the toe. Pt states she has been elevating it and using a walker. Per protocol, pt scheduled for today in office at 1600. Pt agreeable to that plan. RN advised pt to call back if she gets any worse.   Copied from CRM [id number]. Topic: Clinical - Red Word Triage >> Apr 14, 2023  8:50 AM Gurney Maxin H wrote: [facility] that prompted transfer to Nurse Triage: Patient state she dislocated toe having pain Reason for Disposition  [1] Toe injury AND [2] bad limp or can't wear shoes/sandals  Answer Assessment - Initial Assessment Questions 1. MECHANISM: "How did the injury happen?"      Playing with her dog and the dog caught her toe and it dislocated - pinky toe on the L foot 2. ONSET: "When did the injury happen?" (Minutes or hours ago)      Friday at 9pm 3. LOCATION: "What part of the toe is injured?" "Is the nail damaged?"      L pinky toe 4. APPEARANCE of TOE INJURY: "What does the injury look like?"      Toe is swollen, bruised, red 5. SEVERITY: "Can you use the foot normally?" "Can you walk?"      UC told her not to bear weight on that foot "but she can a little bit", walking on her heel 6. SIZE: For cuts, bruises, or swelling, ask: "How large is it?" (e.g., inches or centimeters;  entire toe)      No cuts of lacerations, states  bruising is localized 7. PAIN: "Is there pain?" If Yes, ask: "How bad is the pain?"   (e.g., Scale 1-10; or mild, moderate, severe)     3-4 pain currently without bearing weight 8. TETANUS: For any breaks in the skin, ask: "When was the last tetanus booster?"     No break in skin 9. DIABETES: "Do you have a history of diabetes or poor circulation in the feet?"     No 10. OTHER SYMPTOMS: "Do you have any other symptoms?"        Numbness started yesterday AM. UC numbed the toe and then reset the toe. Pt states she has been elevating the foot. She "thinks" she can feel her toe when she touches it. Pt currently wearing a splint.  Protocols used: Toe Injury-A-AH

## 2023-04-14 NOTE — Telephone Encounter (Signed)
Patient was scheduled for an appt with Mardene Celeste today.

## 2023-04-14 NOTE — Progress Notes (Signed)
   Acute Office Visit   Subjective:  Patient ID: Tara Snyder, female    DOB: 01/06/1976, 48 y.o.   MRN: [id number]  Chief Complaint  Patient presents with   Toe Injury    4x days    HPI Patient is present for an acute visit. Patient was seen at [facility] on 02/13 for left tow injury. Patient had an x-ray completed that showed lateral fifth digit PIP subluxation with no fracture. She was placed in a splint and shoe for at least 2 weeks. Recommended to follow up with PCP or orthopedic provider.   Patient reports she has not been ambulating on the foot. She has been wearing the walking shoe. No other injuries. Patient reports she is still in pain. Rates pain 3/10. She has been taking Ibuprofen 400-800mg  every 4 hours. She reports has seemed to manage with the pain.    ROS See HPI above      Objective:   BP 110/70 (BP Location: Left Arm, Patient Position: Sitting)   Pulse 76   Temp 98 F (36.7 C)   SpO2 98%    Physical Exam Vitals reviewed.  Constitutional:      General: She is not in acute distress.    Appearance: Normal appearance. She is not ill-appearing, toxic-appearing or diaphoretic.  HENT:     Head: Normocephalic and atraumatic.  Eyes:     General:        Right eye: No discharge.        Left eye: No discharge.     Conjunctiva/sclera: Conjunctivae normal.  Cardiovascular:     Rate and Rhythm: Normal rate.  Pulmonary:     Effort: Pulmonary effort is normal. No respiratory distress.  Musculoskeletal:        General: Normal range of motion.     Comments: Metal splint over fifth left toe. Mild edema and bruising noted at the digit and around the area. Strong pedal pulse. Sensation intact. Able to move the first 3 toes with no difficulty.   Skin:    General: Skin is warm and dry.  Neurological:     General: No focal deficit present.     Mental Status: She is alert and oriented to person, place, and time. Mental status  is at baseline.     Gait: Gait abnormal (Wheelchair).  Psychiatric:        Mood and Affect: Mood normal.        Behavior: Behavior normal.        Thought Content: Thought content normal.        Judgment: Judgment normal.       Assessment & Plan:  Dislocation of toe, right, closed, subsequent encounter -     Ambulatory referral to Orthopedic Surgery  -Placed an urgent referral to orthopedic for dislocation of left fifth digit. If you do not hear back in a 2-3 days about an appointment, please call the office or send a MyChart message. -Continue to wear split and walking boot.  -Do not bear weight on the foot. -Continue to use Ibuprofen for pain since it is managing the pain.    Zandra Abts, NP

## 2023-04-14 NOTE — Telephone Encounter (Signed)
 Noted- ok to close.

## 2023-04-14 NOTE — Patient Instructions (Signed)
-  Placed an urgent referral to orthopedic for dislocation of left fifth digit. If you do not hear back in a 2-3 days about an appointment, please call the office or send a MyChart message. -Continue to wear split and walking boot.  -Do not bear weight on the foot. -Continue to use Ibuprofen for pain since it is managing the pain.

## 2023-04-15 ENCOUNTER — Telehealth: Payer: Self-pay | Admitting: *Deleted

## 2023-04-15 ENCOUNTER — Encounter (HOSPITAL_BASED_OUTPATIENT_CLINIC_OR_DEPARTMENT_OTHER): Payer: Self-pay | Admitting: Student

## 2023-04-15 ENCOUNTER — Ambulatory Visit (HOSPITAL_BASED_OUTPATIENT_CLINIC_OR_DEPARTMENT_OTHER): Payer: Commercial Managed Care - PPO | Admitting: Student

## 2023-04-15 ENCOUNTER — Ambulatory Visit (HOSPITAL_BASED_OUTPATIENT_CLINIC_OR_DEPARTMENT_OTHER): Payer: Commercial Managed Care - PPO

## 2023-04-15 DIAGNOSIS — M79672 Pain in left foot: Secondary | ICD-10-CM

## 2023-04-15 DIAGNOSIS — E669 Obesity, unspecified: Secondary | ICD-10-CM

## 2023-04-15 DIAGNOSIS — S93105A Unspecified dislocation of left toe(s), initial encounter: Secondary | ICD-10-CM | POA: Diagnosis not present

## 2023-04-15 MED ORDER — TIRZEPATIDE-WEIGHT MANAGEMENT 5 MG/0.5ML ~~LOC~~ SOLN: 5.0000 mg | SUBCUTANEOUS | 0 refills | Status: DC

## 2023-04-15 NOTE — Telephone Encounter (Signed)
Script sent to [facility] [place]

## 2023-04-15 NOTE — Progress Notes (Unsigned)
Chief Complaint: Left pinky toe dislocation     History of Present Illness:   Discussed the use of AI scribe software for clinical note transcription with the patient, who gave verbal consent to proceed.  Tara Snyder is a 48 year old female who presents with a dislocated pinky toe. The dislocation occurred 6 days ago while she was playing with her large golden retriever puppy. She recalls running and possibly hitting her toe against a doorway when the dog moved in front of her. Initially, she sought care at an urgent care facility where x-rays were performed, and she was placed in a surgical shoe after a block and reduction attempt. The x-rays showed no fractures. She experiences throbbing pain in the pinky toe, especially when touched or moved, rating the pain as a six out of ten. Has been taking ibuprofen and Tylenol. There is some numbness in the toe but no tingling. She manages the pain with ibuprofen, which she states is effective. 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     Surgical History:   None  PMH/PSH/Family History/Social History/Meds/Allergies:    Past Medical History:  Diagnosis Date   HUS (hemolytic uremic syndrome) (HCC) 1987   Hypothyroid    Shingles    Shoulder dislocation, recurrent, right    Past Surgical History:  Procedure Laterality Date   right shoulder  2017   recurrent dislocation right shoulder; surgery for stabilization   Social History   Socioeconomic History   Marital status: Married    Spouse name: Not on file   Number of children: Not on file   Years of education: Not on file   Highest education level: Master's degree (e.g., MA, MS, MEng, MEd, MSW, MBA)  Occupational History   Not on file  Tobacco Use   Smoking status: Never   Smokeless tobacco: Never  Substance and Sexual Activity   Alcohol use: Not on file   Drug use: Not on file   Sexual activity: Not on file   Other Topics Concern   Not on file  Social History Narrative   Not on file   Social Drivers of Health   Financial Resource Strain: Low Risk  (03/21/2021)   Overall Financial Resource Strain (CARDIA)    Difficulty of Paying Living Expenses: Not hard at all  Food Insecurity: No Food Insecurity (03/21/2021)   Hunger Vital Sign    Worried About Running Out of Food in the Last Year: Never true    Ran Out of Food in the Last Year: Never true  Transportation Needs: No Transportation Needs (03/21/2021)   PRAPARE - Administrator, Civil Service (Medical): No    Lack of Transportation (Non-Medical): No  Physical Activity: Sufficiently Active (03/21/2021)   Exercise Vital Sign    Days of Exercise per Week: 5 days    Minutes of Exercise per Session: 30 min  Stress: Stress Concern Present (03/21/2021)   [employer] of Occupational Health - Occupational Stress Questionnaire    Feeling of Stress : To some extent  Social Connections: Socially Integrated (03/21/2021)   Social Connection and Isolation Panel [NHANES]    Frequency of Communication with Friends and Family: Twice a week    Frequency of Social Gatherings with Friends and Family: Three times a week  Attends Religious Services: More than 4 times per year    Active Member of Clubs or Organizations: Yes    Attends Engineer, structural: More than 4 times per year    Marital Status: Married   Family History  Problem Relation Age of Onset   High Cholesterol Mother    Diabetes Mother    Heart attack Father 21   Heart disease Father    High Cholesterol Father    High blood pressure Father    Heart failure Father    High Cholesterol Brother    Cancer Maternal Grandmother        blood   Other Maternal Grandmother        skin issues; multiple   Diabetes Maternal Grandfather        insulin depdt   High blood pressure Paternal Grandmother    High Cholesterol Paternal Grandmother    Stroke Paternal Grandmother  79   Obesity Paternal Grandmother    Hearing loss Paternal Grandfather    Heart attack Paternal Grandfather 71   Heart disease Paternal Grandfather    No Known Allergies Current Outpatient Medications  Medication Sig Dispense Refill   levothyroxine (SYNTHROID) 125 MCG tablet Take 1 tablet (125 mcg total) by mouth daily. 90 tablet 1   tirzepatide 5 MG/0.5ML injection vial Inject 5 mg into the skin once a week. 2 mL 0   No current facility-administered medications for this visit.   No results found.  Review of Systems:   A ROS was performed including pertinent positives and negatives as documented in the HPI.  Physical Exam :   Constitutional: NAD and appears stated age Neurological: Alert and oriented Psych: Appropriate affect and cooperative There were no vitals taken for this visit.   Comprehensive Musculoskeletal Exam:    Exam of the left fifth toe demonstrates no obvious visual deformity.  Mild swelling and ecchymosis of the toe with intact sensation distally.  Patient is able to actively flex and extend the toe.  Tenderness over the PIP joint.  Brisk capillary refill distally with 2+ dorsalis pedis.  Imaging:   Xray (left foot 3 views): Fifth toe appears dislocated at the PIP joint without associated fracture or significant angulation.   I personally reviewed and interpreted the radiographs.   Assessment and Plan:    Dislocated Pinky Toe   The dislocation resulted from an injury 6 days ago. X-ray reveals no fracture, but the toe remains subluxed, causing pain with weight bearing and mild numbness. Manual reduction was attempted today in clinic but was unsuccessful. Recommend close follow up with myself and Dr. Steward Drone to for nerve block and reduction. Continue buddy taping the toe and using a surgical shoe. Continue ibuprofen and Tylenol as needed for pain.       I personally saw and evaluated the patient, and participated in the management and treatment  plan.  Hazle Nordmann, PA-C Orthopedics

## 2023-04-15 NOTE — Telephone Encounter (Signed)
[facility] sent [facility] stating the patient is requesting a new Rx for Zepbound 5mg .  Message sent to PCP.

## 2023-04-17 ENCOUNTER — Other Ambulatory Visit: Payer: Self-pay | Admitting: Family Medicine

## 2023-04-17 DIAGNOSIS — E039 Hypothyroidism, unspecified: Secondary | ICD-10-CM

## 2023-04-18 ENCOUNTER — Other Ambulatory Visit: Payer: Self-pay | Admitting: Family Medicine

## 2023-04-18 DIAGNOSIS — E039 Hypothyroidism, unspecified: Secondary | ICD-10-CM

## 2023-04-18 MED ORDER — LEVOTHYROXINE SODIUM 125 MCG PO TABS: 125.0000 ug | ORAL_TABLET | Freq: Every day | ORAL | 0 refills | Status: DC

## 2023-04-25 ENCOUNTER — Ambulatory Visit: Payer: Commercial Managed Care - PPO | Admitting: Family Medicine

## 2023-05-25 ENCOUNTER — Other Ambulatory Visit: Payer: Self-pay | Admitting: Family Medicine

## 2023-05-25 DIAGNOSIS — E669 Obesity, unspecified: Secondary | ICD-10-CM

## 2023-05-26 NOTE — Telephone Encounter (Signed)
 Please advise patient that she will need an appointment with me soon -- looks like she saw Kandee Keen in January but hasn't seen me since April 2024.

## 2023-05-26 NOTE — Telephone Encounter (Signed)
 Left a detailed message with the information below at the patient's cell number.

## 2023-06-05 ENCOUNTER — Ambulatory Visit (INDEPENDENT_AMBULATORY_CARE_PROVIDER_SITE_OTHER): Admitting: Family Medicine

## 2023-06-05 VITALS — BP 136/78 | HR 78 | Temp 97.9°F | Ht 62.25 in | Wt 184.7 lb

## 2023-06-05 DIAGNOSIS — Z13220 Encounter for screening for lipoid disorders: Secondary | ICD-10-CM

## 2023-06-05 DIAGNOSIS — N951 Menopausal and female climacteric states: Secondary | ICD-10-CM

## 2023-06-05 DIAGNOSIS — Z0000 Encounter for general adult medical examination without abnormal findings: Secondary | ICD-10-CM

## 2023-06-05 DIAGNOSIS — E669 Obesity, unspecified: Secondary | ICD-10-CM | POA: Diagnosis not present

## 2023-06-05 DIAGNOSIS — E039 Hypothyroidism, unspecified: Secondary | ICD-10-CM

## 2023-06-05 DIAGNOSIS — Z114 Encounter for screening for human immunodeficiency virus [HIV]: Secondary | ICD-10-CM | POA: Diagnosis not present

## 2023-06-05 DIAGNOSIS — G4709 Other insomnia: Secondary | ICD-10-CM

## 2023-06-05 MED ORDER — LEVOTHYROXINE SODIUM 125 MCG PO TABS: 125.0000 ug | ORAL_TABLET | Freq: Every day | ORAL | 0 refills | Status: DC

## 2023-06-05 MED ORDER — TRAZODONE HCL 50 MG PO TABS: 25.0000 mg | ORAL_TABLET | Freq: Every evening | ORAL | 0 refills | Status: DC | PRN

## 2023-06-05 MED ORDER — TIRZEPATIDE 7.5 MG/0.5ML ~~LOC~~ SOAJ: 7.5000 mg | SUBCUTANEOUS | 0 refills | Status: DC

## 2023-06-05 NOTE — Patient Instructions (Signed)
 Health Maintenance, Female Adopting a healthy lifestyle and getting preventive care are important in promoting health and wellness. Ask your health care provider about: The right schedule for you to have regular tests and exams. Things you can do on your own to prevent diseases and keep yourself healthy. What should I know about diet, weight, and exercise? Eat a healthy diet  Eat a diet that includes plenty of vegetables, fruits, low-fat dairy products, and lean protein. Do not eat a lot of foods that are high in solid fats, added sugars, or sodium. Maintain a healthy weight Body mass index (BMI) is used to identify weight problems. It estimates body fat based on height and weight. Your health care provider can help determine your BMI and help you achieve or maintain a healthy weight. Get regular exercise Get regular exercise. This is one of the most important things you can do for your health. Most adults should: Exercise for at least 150 minutes each week. The exercise should increase your heart rate and make you sweat (moderate-intensity exercise). Do strengthening exercises at least twice a week. This is in addition to the moderate-intensity exercise. Spend less time sitting. Even light physical activity can be beneficial. Watch cholesterol and blood lipids Have your blood tested for lipids and cholesterol at 48 years of age, then have this test every 5 years. Have your cholesterol levels checked more often if: Your lipid or cholesterol levels are high. You are older than 48 years of age. You are at high risk for heart disease. What should I know about cancer screening? Depending on your health history and family history, you may need to have cancer screening at various ages. This may include screening for: Breast cancer. Cervical cancer. Colorectal cancer. Skin cancer. Lung cancer. What should I know about heart disease, diabetes, and high blood pressure? Blood pressure and heart  disease High blood pressure causes heart disease and increases the risk of stroke. This is more likely to develop in people who have high blood pressure readings or are overweight. Have your blood pressure checked: Every 3-5 years if you are 48-48 years of age. Every year if you are 48 years old or older. Diabetes Have regular diabetes screenings. This checks your fasting blood sugar level. Have the screening done: Once every three years after age 48 if you are at a normal weight and have a low risk for diabetes. More often and at a younger age if you are overweight or have a high risk for diabetes. What should I know about preventing infection? Hepatitis B If you have a higher risk for hepatitis B, you should be screened for this virus. Talk with your health care provider to find out if you are at risk for hepatitis B infection. Hepatitis C Testing is recommended for: Everyone born from [birth year] through 1965. Anyone with known risk factors for hepatitis C. Sexually transmitted infections (STIs) Get screened for STIs, including gonorrhea and chlamydia, if: You are sexually active and younger than 48 years of age. are younger than 48 years of age. You are older than 48 years of age and your health care provider tells you that you are at risk for this type of infection. Your sexual activity has changed since you were last screened, and you are at increased risk for chlamydia or gonorrhea. Ask your health care provider if you are at risk. Ask your health care provider about whether you are at high risk for HIV. Your health care provider may recommend a prescription medicine to help prevent HIV  infection. If you choose to take medicine to prevent HIV, you should first get tested for HIV. You should then be tested every 3 months for as long as you are taking the medicine. Pregnancy If you are about to stop having your period (premenopausal) and you may become pregnant, seek counseling before you get pregnant. Take 400 to 800  micrograms (mcg) of folic acid every day if you become pregnant. Ask for birth control (contraception) if you want to prevent pregnancy. Osteoporosis and menopause Osteoporosis is a disease in which the bones lose minerals and strength with aging. This can result in bone fractures. If you are 48 years old or older, or if you are at risk for osteoporosis and fractures, ask your health care provider if you should: Be screened for bone loss. Take a calcium or vitamin D supplement to lower your risk of fractures. Be given hormone replacement therapy (HRT) to treat symptoms of menopause. Follow these instructions at home: Alcohol use Do not drink alcohol if: Your health care provider tells you not to drink. You are pregnant, may be pregnant, or are planning to become pregnant. If you drink alcohol: Limit how much you have to: 0-1 drink a day. Know how much alcohol is in your drink. In the [place] one drink equals one 12 oz bottle of beer (355 mL), one 5 oz glass of wine (148 mL), or one 1 oz glass of hard liquor (44 mL). Lifestyle Do not use any products that contain nicotine or tobacco. These products include cigarettes, chewing tobacco, and vaping devices, such as e-cigarettes. If you need help quitting, ask your health care provider. Do not use street drugs. Do not share needles. Ask your health care provider for help if you need support or information about quitting drugs. General instructions Schedule regular health, dental, and eye exams. Stay current with your vaccines. Tell your health care provider if: You often feel depressed. You have ever been abused or do not feel safe at home. Summary Adopting a healthy lifestyle and getting preventive care are important in promoting health and wellness. Follow your health care provider's instructions about healthy diet, exercising, and getting tested or screened for diseases. Follow your health care provider's instructions on monitoring your  cholesterol and blood pressure. This information is not intended to replace advice given to you by your health care provider. Make sure you discuss any questions you have with your health care provider. Document Revised: 07/03/2020 Document Reviewed: 07/03/2020 Elsevier Patient Education  2024 [employer].

## 2023-06-05 NOTE — Progress Notes (Signed)
 Complete physical exam  Patient: Tara Snyder   DOB: 1975-06-27   48 y.o. Female  MRN: [id number]  Subjective:    Chief Complaint  Patient presents with   Annual Exam    Misbah A Shafer is a 48 y.o. female who presents today for a complete physical exam. She reports consuming a general diet. Home exercise routine includes walking 2-3 hrs per week. She generally feels well. She reports sleeping well,  she occasionally has trouble with multiple nighttime awakenings about 3 days out of the week on average. She does have additional problems to discuss today.   Hypothyroidism -- patient is requesting refills of her levothyroxine today, she reports no weight gain, no constipation and no depression symptoms today. Last TSH was reviewed and it was too low, her dose was decreased to 125 mcg and so she needs a follow up TSH today for dosage adjustment.   Most recent fall risk assessment:    04/14/2023    4:03 PM  Fall Risk   Falls in the past year? 0  Number falls in past yr: 0  Injury with Fall? 0  Follow up Falls evaluation completed     Most recent depression screenings:    06/05/2023    3:08 PM 04/14/2023    4:04 PM  PHQ 2/9 Scores  PHQ - 2 Score 0 0  PHQ- 9 Score 0     Vision:Within last year and Dental: No current dental problems and Receives regular dental care  Patient Active Problem List   Diagnosis Date Noted   Insomnia disorder 06/12/2022   Anxiety 06/12/2022   Obesity (BMI 35.0-39.9 without comorbidity) 06/12/2022   Hypothyroid 03/21/2021   COVID-19 virus detected 09/02/2018      Patient Care Team: Aida House, MD as PCP - General (Family Medicine)   Outpatient Medications Prior to Visit  Medication Sig   VITAMIN D PO Take by mouth daily.   [DISCONTINUED] levothyroxine (SYNTHROID) 125 MCG tablet Take 1 tablet (125 mcg total) by mouth daily.   [DISCONTINUED] tirzepatide (ZEPBOUND) 5 MG/0.5ML Pen INJECT 5 MG SUBCUTANEOUSLY WEEKLY   No  facility-administered medications prior to visit.    Review of Systems  HENT:  Negative for hearing loss.   Eyes:  Negative for blurred vision.  Respiratory:  Negative for shortness of breath.   Cardiovascular:  Negative for chest pain.  Gastrointestinal: Negative.   Genitourinary: Negative.   Musculoskeletal:  Negative for back pain.  Neurological:  Negative for headaches.  Psychiatric/Behavioral:  Negative for depression.        Objective:     BP 136/78   Pulse 78   Temp 97.9 F (36.6 C) (Oral)   Ht 5' 2.25" (1.581 m)   Wt 184 lb 11.2 oz (83.8 kg)   SpO2 98%   BMI 33.51 kg/m    Physical Exam Vitals reviewed.  Constitutional:      Appearance: Normal appearance. She is well-groomed. She is obese.  HENT:     Right Ear: Tympanic membrane and ear canal normal.     Left Ear: Tympanic membrane and ear canal normal.     Mouth/Throat:     Mouth: Mucous membranes are moist.     Pharynx: No posterior oropharyngeal erythema.  Eyes:     Conjunctiva/sclera: Conjunctivae normal.  Neck:     Thyroid: No thyromegaly.  Cardiovascular:     Rate and Rhythm: Normal rate and regular rhythm.     Pulses: Normal pulses.  Heart sounds: S1 normal and S2 normal.  Pulmonary:     Effort: Pulmonary effort is normal.     Breath sounds: Normal breath sounds and air entry.  Abdominal:     General: Bowel sounds are normal.     Palpations: Abdomen is soft.  Musculoskeletal:     Right lower leg: No edema.     Left lower leg: No edema.  Lymphadenopathy:     Cervical: No cervical adenopathy.  Neurological:     Mental Status: She is alert and oriented to person, place, and time. Mental status is at baseline.     Gait: Gait is intact.  Psychiatric:        Mood and Affect: Mood and affect normal.        Speech: Speech normal.        Behavior: Behavior normal.        Judgment: Judgment normal.      No results found for any visits on 06/05/23.     Assessment & Plan:    Routine  Health Maintenance and Physical Exam  Immunization History  Administered Date(s) Administered   Moderna Sars-Covid-2 Vaccination 07/13/2019, 08/03/2019   PPD Test 02/25/2006   Tdap 10/27/2019    Health Maintenance  Topic Date Due   HIV Screening  Never done   Cervical Cancer Screening (HPV/Pap Cotest)  Never done   Fecal DNA (Cologuard)  Never done   COVID-19 Vaccine (4 - 2024-25 season) 10/27/2022   INFLUENZA VACCINE  09/26/2023   DTaP/Tdap/Td (2 - Td or Tdap) 10/26/2029   Hepatitis C Screening  Completed   HPV VACCINES  Aged Out   Meningococcal B Vaccine  Aged Out    Discussed health benefits of physical activity, and encouraged her to engage in regular exercise appropriate for her age and condition.  Obesity (BMI 35.0-39.9 without comorbidity) -     CBC with Differential/Platelet -     Comprehensive metabolic panel with GFR -     Tirzepatide; Inject 7.5 mg into the skin once a week.  Dispense: 2 mL; Refill: 0  Hypothyroidism, unspecified type Assessment & Plan: We reduced her levothyroxine to 125 mcg after the last TSH. She needs a repeat TSH to check the new dose. TSH has been ordered.  Orders: -     Levothyroxine Sodium; Take 1 tablet (125 mcg total) by mouth daily.  Dispense: 90 tablet; Refill: 0 -     TSH; Future  Lipid screening -     Lipid panel; Future  Encounter for screening for HIV -     HIV Antibody (routine testing w rflx); Future  Routine general medical examination at a health care facility -     traZODone HCl; Take 0.5 tablets (25 mg total) by mouth at bedtime as needed for sleep.  Dispense: 30 tablet; Refill: 0  Other insomnia Assessment & Plan: Melatonin was not effective in keeping her asleep, we discussed a trial of PRN trazodone to help more with sleep maintenance, pt states she will try it   Physical exam findings are normal today, I counseled the patient on healthy sleep habits and handouts were given on healthy eating and exercise. Labs  ordered for annual surveillance. I have asked for records of her most recent pap and mammogram from  her GYN. Will update her health maintenance measures when received.   Return in about 6 months (around 12/05/2023) for hypothyroid and weight loss.     Aida House, MD

## 2023-06-06 ENCOUNTER — Telehealth: Payer: Self-pay

## 2023-06-06 ENCOUNTER — Other Ambulatory Visit (HOSPITAL_COMMUNITY): Payer: Self-pay

## 2023-06-06 NOTE — Telephone Encounter (Signed)
 Ok I'll fix it -- I think it was an error it should have gone in as zepbound-- I'll fix it

## 2023-06-06 NOTE — Telephone Encounter (Signed)
 Noted.

## 2023-06-06 NOTE — Telephone Encounter (Signed)
 Tara Snyder is approved exclusively as an adjunct to diet and exercise to improve glycemic control in adults with type 2 diabetes mellitus. A review of patient's medical chart reveals no documented diagnosis of type 2 diabetes or an A1C indicative or diabetes. Therefore, they do not currently meet the criteria for prior authorization of this medication. If clinically appropriate, alternative options such as Delford Field, or Reginal Lutes may be considered for this patient.

## 2023-06-06 NOTE — Telephone Encounter (Signed)
 ERROR

## 2023-06-08 NOTE — Assessment & Plan Note (Signed)
 Melatonin was not effective in keeping her asleep, we discussed a trial of PRN trazodone to help more with sleep maintenance, pt states she will try it

## 2023-06-08 NOTE — Assessment & Plan Note (Signed)
We reduced her levothyroxine to 125 mcg after the last TSH. She needs a repeat TSH to check the new dose. TSH has been ordered.

## 2023-06-11 ENCOUNTER — Other Ambulatory Visit (INDEPENDENT_AMBULATORY_CARE_PROVIDER_SITE_OTHER)

## 2023-06-11 DIAGNOSIS — E039 Hypothyroidism, unspecified: Secondary | ICD-10-CM | POA: Diagnosis not present

## 2023-06-11 DIAGNOSIS — Z13220 Encounter for screening for lipoid disorders: Secondary | ICD-10-CM | POA: Diagnosis not present

## 2023-06-11 DIAGNOSIS — Z114 Encounter for screening for human immunodeficiency virus [HIV]: Secondary | ICD-10-CM

## 2023-06-11 LAB — TSH: TSH: 0.28 u[IU]/mL — ABNORMAL LOW (ref 0.35–5.50)

## 2023-06-11 LAB — LIPID PANEL
Cholesterol: 204 mg/dL — ABNORMAL HIGH (ref 0–200)
HDL: 52.6 mg/dL (ref >39.00)
LDL Cholesterol: 138 mg/dL — ABNORMAL HIGH (ref 0–99)
NonHDL: 151.07
Total CHOL/HDL Ratio: 4
Triglycerides: 66 mg/dL (ref 0.0–149.0)
VLDL: 13.2 mg/dL (ref 0.0–40.0)

## 2023-06-12 LAB — HIV ANTIBODY (ROUTINE TESTING W REFLEX): HIV 1&2 Ab, 4th Generation: NONREACTIVE

## 2023-06-16 ENCOUNTER — Encounter: Payer: Self-pay | Admitting: Family Medicine

## 2023-06-16 MED ORDER — LEVOTHYROXINE SODIUM 112 MCG PO TABS: 112.0000 ug | ORAL_TABLET | Freq: Every day | ORAL | 0 refills | Status: DC

## 2023-06-23 ENCOUNTER — Other Ambulatory Visit (HOSPITAL_COMMUNITY): Payer: Self-pay

## 2023-06-27 ENCOUNTER — Other Ambulatory Visit: Payer: Self-pay | Admitting: Family Medicine

## 2023-06-27 ENCOUNTER — Other Ambulatory Visit (HOSPITAL_COMMUNITY): Payer: Self-pay

## 2023-06-27 ENCOUNTER — Encounter: Payer: Self-pay | Admitting: Family Medicine

## 2023-06-27 DIAGNOSIS — E669 Obesity, unspecified: Secondary | ICD-10-CM

## 2023-06-27 DIAGNOSIS — Z0000 Encounter for general adult medical examination without abnormal findings: Secondary | ICD-10-CM

## 2023-06-28 MED ORDER — ZEPBOUND 7.5 MG/0.5ML ~~LOC~~ SOAJ: 7.5000 mg | SUBCUTANEOUS | 0 refills | Status: DC

## 2023-06-30 ENCOUNTER — Other Ambulatory Visit (HOSPITAL_COMMUNITY): Payer: Self-pay

## 2023-06-30 ENCOUNTER — Telehealth: Payer: Self-pay

## 2023-06-30 NOTE — Telephone Encounter (Signed)
 Pharmacy Patient Advocate Encounter  Received notification from [facility] that Prior Authorization for Zepbound  7.5 has been APPROVED from 06/30/23 to 06/29/24. Unable to obtain price due to refill too soon rejection, last fill date 06/29/24 next available fill date5/26/25   PA #/Case ID/Reference #: Tara Snyder

## 2023-06-30 NOTE — Telephone Encounter (Signed)
 Pharmacy Patient Advocate Encounter   Received notification from CoverMyMeds that prior authorization for Zepbound  7.5 is required/requested.   Insurance verification completed.   The patient is insured through [facility] .   Per test claim: PA required; PA submitted to above mentioned insurance via CoverMyMeds Key/confirmation #/EOC [id number] Status is pending

## 2023-06-30 NOTE — Telephone Encounter (Signed)
 Pharmacy Patient Advocate Encounter  Received notification from [facility] that Prior Authorization for Zepbound  7.5 has been APPROVED from 06/30/23 to 06/29/24. Ran test claim, Copay is $30.00. This test claim was processed through [facility]- copay amounts may vary at other pharmacies due to pharmacy/plan contracts, or as the patient moves through the different stages of their insurance plan.   PA #/Case ID/Reference #: Tara Snyder

## 2023-07-24 ENCOUNTER — Other Ambulatory Visit: Payer: Self-pay | Admitting: Family Medicine

## 2023-07-24 DIAGNOSIS — E669 Obesity, unspecified: Secondary | ICD-10-CM

## 2023-08-20 ENCOUNTER — Encounter: Payer: Self-pay | Admitting: Family Medicine

## 2023-08-22 ENCOUNTER — Other Ambulatory Visit: Payer: Self-pay | Admitting: Family Medicine

## 2023-08-22 DIAGNOSIS — E669 Obesity, unspecified: Secondary | ICD-10-CM

## 2023-09-08 ENCOUNTER — Other Ambulatory Visit: Payer: Self-pay | Admitting: Family Medicine

## 2023-09-08 DIAGNOSIS — E669 Obesity, unspecified: Secondary | ICD-10-CM

## 2023-09-17 ENCOUNTER — Other Ambulatory Visit: Payer: Self-pay | Admitting: Family Medicine

## 2023-09-17 DIAGNOSIS — E039 Hypothyroidism, unspecified: Secondary | ICD-10-CM

## 2023-09-17 NOTE — Telephone Encounter (Signed)
 Patient hasn't come in for her repeat TSH -- can you message her to schedule a lab appt? The lab is already in the computer waiting.

## 2023-11-14 ENCOUNTER — Other Ambulatory Visit: Payer: Self-pay | Admitting: Family Medicine

## 2023-11-14 DIAGNOSIS — E039 Hypothyroidism, unspecified: Secondary | ICD-10-CM

## 2023-11-24 ENCOUNTER — Telehealth: Payer: Self-pay | Admitting: *Deleted

## 2023-11-24 DIAGNOSIS — Z1211 Encounter for screening for malignant neoplasm of colon: Secondary | ICD-10-CM

## 2023-11-24 NOTE — Telephone Encounter (Signed)
 Copied from CRM [id number]. Topic: Clinical - Lab/Test Results >> Nov 24, 2023  1:23 PM Harlene ORN wrote: Reason for CRM: [facility]  Calling about the patient's sample for a cologuard will be temporarily in cold storage until the PCP renews the order. The sample will be held for up to 25 days starting from 11/17/2023, the day the sample was collected.  Phone: [phone]

## 2023-11-25 NOTE — Telephone Encounter (Signed)
 Order entered as below.

## 2023-11-25 NOTE — Telephone Encounter (Signed)
 Ok to renew the order

## 2023-11-26 ENCOUNTER — Other Ambulatory Visit: Payer: Self-pay | Admitting: Family Medicine

## 2023-11-26 DIAGNOSIS — Z1231 Encounter for screening mammogram for malignant neoplasm of breast: Secondary | ICD-10-CM

## 2023-11-29 LAB — COLOGUARD: COLOGUARD: NEGATIVE

## 2023-12-01 ENCOUNTER — Ambulatory Visit: Payer: Self-pay | Admitting: Family Medicine

## 2023-12-04 ENCOUNTER — Ambulatory Visit: Admitting: Family Medicine

## 2023-12-05 ENCOUNTER — Ambulatory Visit: Admitting: Family Medicine

## 2023-12-05 ENCOUNTER — Ambulatory Visit

## 2023-12-09 ENCOUNTER — Other Ambulatory Visit: Payer: Self-pay | Admitting: Family Medicine

## 2023-12-09 DIAGNOSIS — E039 Hypothyroidism, unspecified: Secondary | ICD-10-CM

## 2023-12-12 ENCOUNTER — Encounter: Payer: Self-pay | Admitting: Family Medicine

## 2023-12-12 ENCOUNTER — Ambulatory Visit: Admitting: Family Medicine

## 2023-12-12 VITALS — BP 102/80 | HR 65 | Temp 97.6°F | Ht 62.25 in | Wt 180.8 lb

## 2023-12-12 DIAGNOSIS — E669 Obesity, unspecified: Secondary | ICD-10-CM

## 2023-12-12 DIAGNOSIS — E039 Hypothyroidism, unspecified: Secondary | ICD-10-CM

## 2023-12-12 LAB — TSH: TSH: 1.17 u[IU]/mL (ref 0.35–5.50)

## 2023-12-12 MED ORDER — PHENTERMINE HCL 15 MG PO CAPS: 15.0000 mg | ORAL_CAPSULE | ORAL | 0 refills | Status: DC

## 2023-12-12 NOTE — Progress Notes (Unsigned)
   Established Patient Office Visit  Subjective   Patient ID: Tara Snyder, female    DOB: December 07, 1975  Age: 48 y.o. MRN: [id number]  Chief Complaint  Patient presents with  . Medical Management of Chronic Issues    HPI   Current Outpatient Medications  Medication Instructions  . Synthroid  112 mcg, Oral, Daily  . VITAMIN D PO Daily    Patient Active Problem List   Diagnosis Date Noted  . Insomnia disorder 06/12/2022  . Anxiety 06/12/2022  . Obesity (BMI 35.0-39.9 without comorbidity) 06/12/2022  . Hypothyroid 03/21/2021  . COVID-19 virus detected 09/02/2018     Review of Systems  All other systems reviewed and are negative.     Objective:     BP 102/80   Pulse 65   Temp 97.6 F (36.4 C) (Oral)   Ht 5' 2.25 (1.581 m)   Wt 180 lb 12.8 oz (82 kg)   SpO2 98%   BMI 32.80 kg/m  {Vitals History (Optional):23777}  Physical Exam   No results found for any visits on 12/12/23.  {Labs (Optional):23779}  The 10-year ASCVD risk score (Arnett DK, et al., 2019) is: 0.7%    Assessment & Plan:  There are no diagnoses linked to this encounter.   No follow-ups on file.    Heron CHRISTELLA Sharper, MD

## 2023-12-15 ENCOUNTER — Ambulatory Visit: Payer: Self-pay | Admitting: Family Medicine

## 2023-12-15 DIAGNOSIS — E039 Hypothyroidism, unspecified: Secondary | ICD-10-CM

## 2023-12-15 MED ORDER — LEVOTHYROXINE SODIUM 112 MCG PO TABS: 112.0000 ug | ORAL_TABLET | Freq: Every day | ORAL | 1 refills | Status: AC

## 2023-12-19 ENCOUNTER — Ambulatory Visit
Admission: RE | Admit: 2023-12-19 | Discharge: 2023-12-19 | Disposition: A | Discharge status: Home / Self Care | Source: Ambulatory Visit | Attending: Family Medicine | Admitting: Family Medicine

## 2023-12-19 DIAGNOSIS — Z1231 Encounter for screening mammogram for malignant neoplasm of breast: Secondary | ICD-10-CM

## 2023-12-24 ENCOUNTER — Other Ambulatory Visit: Payer: Self-pay | Admitting: Family Medicine

## 2023-12-24 ENCOUNTER — Ambulatory Visit: Payer: Self-pay | Admitting: Family Medicine

## 2023-12-24 DIAGNOSIS — Z0000 Encounter for general adult medical examination without abnormal findings: Secondary | ICD-10-CM

## 2023-12-24 NOTE — Telephone Encounter (Signed)
 Would you be able to confirm if patient still needs this or was this an automatic refill request? If pt is no longer taking then ok to refuse

## 2023-12-25 NOTE — Telephone Encounter (Signed)
 Spoke with the patient and she stated she is no longer taking the medication  Denial sent via escribe.

## 2023-12-29 ENCOUNTER — Encounter: Payer: Self-pay | Admitting: Radiology

## 2024-01-14 ENCOUNTER — Other Ambulatory Visit: Payer: Self-pay | Admitting: Family Medicine

## 2024-01-14 DIAGNOSIS — E669 Obesity, unspecified: Secondary | ICD-10-CM
